# Patient Record
Sex: Female | Born: 1975 | Race: Black or African American | Hispanic: No | Marital: Married | Smoking: Never smoker
Health system: Southern US, Community
[De-identification: ages and names within clinical notes are randomized; demographics above are authoritative.]

## PROBLEM LIST (undated history)

## (undated) DIAGNOSIS — D649 Anemia, unspecified: Secondary | ICD-10-CM

## (undated) DIAGNOSIS — I10 Essential (primary) hypertension: Secondary | ICD-10-CM

## (undated) HISTORY — PX: CHOLECYSTECTOMY: SHX55

## (undated) HISTORY — PX: ABDOMINAL SURGERY: SHX537

---

## 1998-04-19 ENCOUNTER — Other Ambulatory Visit: Admission: RE | Admit: 1998-04-19 | Discharge: 1998-04-19 | Payer: Self-pay | Admitting: Obstetrics and Gynecology

## 1998-11-29 ENCOUNTER — Encounter: Payer: Self-pay | Admitting: Gastroenterology

## 1998-11-29 ENCOUNTER — Encounter: Admission: RE | Admit: 1998-11-29 | Discharge: 1998-11-29 | Payer: Self-pay | Admitting: Gastroenterology

## 1999-02-22 ENCOUNTER — Other Ambulatory Visit: Admission: RE | Admit: 1999-02-22 | Discharge: 1999-02-22 | Payer: Self-pay | Admitting: Obstetrics and Gynecology

## 2000-02-02 ENCOUNTER — Inpatient Hospital Stay (HOSPITAL_COMMUNITY): Admission: AD | Admit: 2000-02-02 | Discharge: 2000-02-02 | Payer: Self-pay | Admitting: Obstetrics and Gynecology

## 2000-03-19 ENCOUNTER — Other Ambulatory Visit: Admission: RE | Admit: 2000-03-19 | Discharge: 2000-03-19 | Payer: Self-pay | Admitting: Obstetrics and Gynecology

## 2001-01-14 ENCOUNTER — Encounter: Payer: Self-pay | Admitting: Internal Medicine

## 2001-01-14 ENCOUNTER — Encounter: Admission: RE | Admit: 2001-01-14 | Discharge: 2001-01-14 | Payer: Self-pay | Admitting: Internal Medicine

## 2001-05-01 ENCOUNTER — Inpatient Hospital Stay (HOSPITAL_COMMUNITY): Admission: AD | Admit: 2001-05-01 | Discharge: 2001-05-01 | Payer: Self-pay | Admitting: Obstetrics and Gynecology

## 2002-05-01 ENCOUNTER — Other Ambulatory Visit: Admission: RE | Admit: 2002-05-01 | Discharge: 2002-05-01 | Payer: Self-pay | Admitting: *Deleted

## 2002-06-01 ENCOUNTER — Ambulatory Visit (HOSPITAL_COMMUNITY): Admission: RE | Admit: 2002-06-01 | Discharge: 2002-06-01 | Payer: Self-pay | Admitting: *Deleted

## 2002-06-01 ENCOUNTER — Encounter: Payer: Self-pay | Admitting: *Deleted

## 2003-02-14 ENCOUNTER — Emergency Department (HOSPITAL_COMMUNITY): Admission: AC | Admit: 2003-02-14 | Discharge: 2003-02-14 | Payer: Self-pay

## 2003-07-29 ENCOUNTER — Other Ambulatory Visit: Admission: RE | Admit: 2003-07-29 | Discharge: 2003-07-29 | Payer: Self-pay | Admitting: Family Medicine

## 2005-02-05 ENCOUNTER — Other Ambulatory Visit: Admission: RE | Admit: 2005-02-05 | Discharge: 2005-02-05 | Payer: Self-pay | Admitting: Family Medicine

## 2006-03-22 ENCOUNTER — Other Ambulatory Visit: Admission: RE | Admit: 2006-03-22 | Discharge: 2006-03-22 | Payer: Self-pay | Admitting: Family Medicine

## 2007-06-19 ENCOUNTER — Other Ambulatory Visit: Admission: RE | Admit: 2007-06-19 | Discharge: 2007-06-19 | Payer: Self-pay | Admitting: Family Medicine

## 2010-02-19 ENCOUNTER — Encounter: Payer: Self-pay | Admitting: Gynecology

## 2010-11-28 ENCOUNTER — Other Ambulatory Visit (HOSPITAL_COMMUNITY): Payer: Self-pay | Admitting: Gynecology

## 2010-11-28 DIAGNOSIS — N979 Female infertility, unspecified: Secondary | ICD-10-CM

## 2010-11-30 ENCOUNTER — Ambulatory Visit (HOSPITAL_COMMUNITY)
Admission: RE | Admit: 2010-11-30 | Discharge: 2010-11-30 | Disposition: A | Discharge status: Home / Self Care | Payer: BC Managed Care – PPO | Source: Ambulatory Visit | Attending: Gynecology | Admitting: Gynecology

## 2010-11-30 DIAGNOSIS — N979 Female infertility, unspecified: Secondary | ICD-10-CM

## 2010-11-30 MED ORDER — IOHEXOL 300 MG/ML  SOLN
40.0000 mL | Freq: Once | INTRAMUSCULAR | Status: AC | PRN
  Administered 2010-11-30: 40 mL

## 2011-03-02 ENCOUNTER — Other Ambulatory Visit: Payer: Self-pay | Admitting: Obstetrics and Gynecology

## 2011-03-02 DIAGNOSIS — D219 Benign neoplasm of connective and other soft tissue, unspecified: Secondary | ICD-10-CM

## 2011-04-04 ENCOUNTER — Other Ambulatory Visit: Payer: BC Managed Care – PPO

## 2011-04-17 ENCOUNTER — Ambulatory Visit
Admission: RE | Admit: 2011-04-17 | Discharge: 2011-04-17 | Disposition: A | Discharge status: Home / Self Care | Payer: BC Managed Care – PPO | Source: Ambulatory Visit | Attending: Obstetrics and Gynecology | Admitting: Obstetrics and Gynecology

## 2011-04-17 DIAGNOSIS — D219 Benign neoplasm of connective and other soft tissue, unspecified: Secondary | ICD-10-CM

## 2011-04-17 MED ORDER — GADOBENATE DIMEGLUMINE 529 MG/ML IV SOLN
20.0000 mL | Freq: Once | INTRAVENOUS | Status: AC | PRN
  Administered 2011-04-17: 20 mL via INTRAVENOUS

## 2011-08-31 ENCOUNTER — Ambulatory Visit: Admit: 2011-08-31 | Payer: Self-pay | Admitting: Obstetrics and Gynecology

## 2011-08-31 SURGERY — ROBOTIC ASSISTED MYOMECTOMY
Anesthesia: General

## 2011-09-07 ENCOUNTER — Other Ambulatory Visit: Payer: Self-pay | Admitting: Obstetrics and Gynecology

## 2011-09-11 ENCOUNTER — Encounter (HOSPITAL_COMMUNITY): Payer: Self-pay | Admitting: Pharmacist

## 2011-09-14 ENCOUNTER — Encounter (HOSPITAL_COMMUNITY)
Admission: RE | Admit: 2011-09-14 | Discharge: 2011-09-14 | Disposition: A | Discharge status: Home / Self Care | Payer: BC Managed Care – PPO | Source: Ambulatory Visit | Attending: Obstetrics and Gynecology | Admitting: Obstetrics and Gynecology

## 2011-09-14 ENCOUNTER — Encounter (HOSPITAL_COMMUNITY): Payer: Self-pay

## 2011-09-14 HISTORY — DX: Anemia, unspecified: D64.9

## 2011-09-14 LAB — CBC
HCT: 35.8 % — ABNORMAL LOW (ref 36.0–46.0)
Hemoglobin: 11.7 g/dL — ABNORMAL LOW (ref 12.0–15.0)
MCH: 24.5 pg — ABNORMAL LOW (ref 26.0–34.0)
MCHC: 32.7 g/dL (ref 30.0–36.0)
MCV: 74.9 fL — ABNORMAL LOW (ref 78.0–100.0)
Platelets: 224 10*3/uL (ref 150–400)
RBC: 4.78 MIL/uL (ref 3.87–5.11)
RDW: 17.2 % — ABNORMAL HIGH (ref 11.5–15.5)
WBC: 6.8 10*3/uL (ref 4.0–10.5)

## 2011-09-14 LAB — SURGICAL PCR SCREEN
MRSA, PCR: NEGATIVE
Staphylococcus aureus: NEGATIVE

## 2011-09-14 NOTE — Patient Instructions (Addendum)
Your procedure is scheduled on:09/21/11  Enter through the Main Entrance at :6am Pick up desk phone and dial [phone] and inform us of your arrival.  Please call [phone] if you have any problems the morning of surgery.  Remember: Do not eat after midnight:Thursday Do not drink after:midnight Thursday  Take these meds the morning of surgery with a sip of water:NONE  DO NOT wear jewelry, eye make-up, lipstick,body lotion, or dark fingernail polish. Do not shave for 48 hours prior to surgery.  If you are to be admitted after surgery, leave suitcase in car until your room has been assigned. Patients discharged on the day of surgery will not be allowed to drive home.   Remember to use your Hibiclens as instructed.

## 2011-09-17 ENCOUNTER — Other Ambulatory Visit: Payer: Self-pay | Admitting: Obstetrics and Gynecology

## 2011-09-21 ENCOUNTER — Encounter (HOSPITAL_COMMUNITY)
Admission: RE | Disposition: A | Discharge status: Home / Self Care | Payer: Self-pay | Source: Ambulatory Visit | Attending: Obstetrics and Gynecology

## 2011-09-21 ENCOUNTER — Encounter (HOSPITAL_COMMUNITY): Payer: Self-pay | Admitting: *Deleted

## 2011-09-21 ENCOUNTER — Encounter (HOSPITAL_COMMUNITY): Payer: Self-pay | Admitting: Anesthesiology

## 2011-09-21 ENCOUNTER — Observation Stay (HOSPITAL_COMMUNITY)
Admission: RE | Admit: 2011-09-21 | Discharge: 2011-09-21 | Disposition: A | Discharge status: Home / Self Care | Payer: BC Managed Care – PPO | Source: Ambulatory Visit | Attending: Obstetrics and Gynecology | Admitting: Obstetrics and Gynecology

## 2011-09-21 ENCOUNTER — Ambulatory Visit (HOSPITAL_COMMUNITY): Payer: BC Managed Care – PPO | Admitting: Anesthesiology

## 2011-09-21 DIAGNOSIS — N920 Excessive and frequent menstruation with regular cycle: Principal | ICD-10-CM | POA: Insufficient documentation

## 2011-09-21 DIAGNOSIS — D649 Anemia, unspecified: Secondary | ICD-10-CM | POA: Insufficient documentation

## 2011-09-21 DIAGNOSIS — Z9889 Other specified postprocedural states: Secondary | ICD-10-CM

## 2011-09-21 DIAGNOSIS — D259 Leiomyoma of uterus, unspecified: Secondary | ICD-10-CM | POA: Insufficient documentation

## 2011-09-21 HISTORY — PX: ROBOT ASSISTED MYOMECTOMY: SHX5142

## 2011-09-21 LAB — BASIC METABOLIC PANEL
BUN: 7 mg/dL (ref 6–23)
CO2: 26 mEq/L (ref 19–32)
Calcium: 8.5 mg/dL (ref 8.4–10.5)
Chloride: 101 mEq/L (ref 96–112)
Creatinine, Ser: 0.85 mg/dL (ref 0.50–1.10)
GFR calc Af Amer: >90 mL/min (ref >90)
GFR calc non Af Amer: 86 mL/min — ABNORMAL LOW (ref >90)
Glucose, Bld: 145 mg/dL — ABNORMAL HIGH (ref 70–99)
Potassium: 3.8 mEq/L (ref 3.5–5.1)
Sodium: 135 mEq/L (ref 135–145)

## 2011-09-21 LAB — CBC
HCT: 35.4 % — ABNORMAL LOW (ref 36.0–46.0)
Hemoglobin: 11.6 g/dL — ABNORMAL LOW (ref 12.0–15.0)
MCH: 24.6 pg — ABNORMAL LOW (ref 26.0–34.0)
MCHC: 32.8 g/dL (ref 30.0–36.0)
MCV: 75.2 fL — ABNORMAL LOW (ref 78.0–100.0)
Platelets: 196 10*3/uL (ref 150–400)
RBC: 4.71 MIL/uL (ref 3.87–5.11)
RDW: 17.5 % — ABNORMAL HIGH (ref 11.5–15.5)
WBC: 12.2 10*3/uL — ABNORMAL HIGH (ref 4.0–10.5)

## 2011-09-21 LAB — TYPE AND SCREEN
ABO/RH(D): O NEG
Antibody Screen: NEGATIVE

## 2011-09-21 LAB — GLUCOSE, CAPILLARY
Glucose-Capillary: 106 mg/dL — ABNORMAL HIGH (ref 70–99)
Glucose-Capillary: 145 mg/dL — ABNORMAL HIGH (ref 70–99)

## 2011-09-21 LAB — PREGNANCY, URINE: Preg Test, Ur: NEGATIVE

## 2011-09-21 LAB — ABO/RH: ABO/RH(D): O NEG

## 2011-09-21 SURGERY — ROBOTIC ASSISTED MYOMECTOMY
Anesthesia: General | Site: Uterus | Wound class: Clean Contaminated

## 2011-09-21 MED ORDER — METRONIDAZOLE 500 MG PO TABS
500.0000 mg | ORAL_TABLET | Freq: Two times a day (BID) | ORAL | Status: DC
  Administered 2011-09-21: 500 mg via ORAL
  Filled 2011-09-21 (×3): qty 1

## 2011-09-21 MED ORDER — SODIUM CHLORIDE 0.9 % IJ SOLN
INTRAMUSCULAR | Status: DC | PRN
  Administered 2011-09-21: 60 mL via INTRAVENOUS

## 2011-09-21 MED ORDER — ONDANSETRON HCL 4 MG/2ML IJ SOLN
4.0000 mg | Freq: Four times a day (QID) | INTRAMUSCULAR | Status: DC | PRN
  Administered 2011-09-21: 4 mg via INTRAVENOUS
  Filled 2011-09-21: qty 2

## 2011-09-21 MED ORDER — ROCURONIUM BROMIDE 50 MG/5ML IV SOLN
INTRAVENOUS | Status: AC
  Filled 2011-09-21: qty 1

## 2011-09-21 MED ORDER — HYDROMORPHONE HCL PF 1 MG/ML IJ SOLN: 0.2000 mg | INTRAMUSCULAR | Status: DC | PRN

## 2011-09-21 MED ORDER — BUPIVACAINE HCL (PF) 0.25 % IJ SOLN
INTRAMUSCULAR | Status: DC | PRN
  Administered 2011-09-21: 10 mL

## 2011-09-21 MED ORDER — OXYCODONE-ACETAMINOPHEN 5-325 MG PO TABS: 1.0000 | ORAL_TABLET | ORAL | Status: DC | PRN

## 2011-09-21 MED ORDER — METHYLENE BLUE 1 % INJ SOLN
INTRAMUSCULAR | Status: AC
  Filled 2011-09-21: qty 1

## 2011-09-21 MED ORDER — HYDROMORPHONE HCL PF 1 MG/ML IJ SOLN
0.2500 mg | INTRAMUSCULAR | Status: DC | PRN
  Administered 2011-09-21 (×4): 0.5 mg via INTRAVENOUS

## 2011-09-21 MED ORDER — OXYCODONE-ACETAMINOPHEN 5-325 MG PO TABS: 1.0000 | ORAL_TABLET | ORAL | Status: AC | PRN

## 2011-09-21 MED ORDER — LIDOCAINE HCL (CARDIAC) 20 MG/ML IV SOLN
INTRAVENOUS | Status: AC
  Filled 2011-09-21: qty 5

## 2011-09-21 MED ORDER — HYDROCODONE-ACETAMINOPHEN 5-325 MG PO TABS: 1.0000 | ORAL_TABLET | ORAL | Status: DC | PRN

## 2011-09-21 MED ORDER — ONDANSETRON HCL 4 MG/2ML IJ SOLN
INTRAMUSCULAR | Status: AC
  Filled 2011-09-21: qty 2

## 2011-09-21 MED ORDER — PROPOFOL 10 MG/ML IV EMUL
INTRAVENOUS | Status: AC
  Filled 2011-09-21: qty 20

## 2011-09-21 MED ORDER — BUPIVACAINE HCL (PF) 0.25 % IJ SOLN
INTRAMUSCULAR | Status: AC
  Filled 2011-09-21: qty 30

## 2011-09-21 MED ORDER — LACTATED RINGERS IR SOLN
Status: DC | PRN
  Administered 2011-09-21: 1

## 2011-09-21 MED ORDER — PROPOFOL 10 MG/ML IV EMUL
INTRAVENOUS | Status: DC | PRN
  Administered 2011-09-21: 200 mg via INTRAVENOUS

## 2011-09-21 MED ORDER — DEXAMETHASONE SODIUM PHOSPHATE 4 MG/ML IJ SOLN
INTRAMUSCULAR | Status: DC | PRN
  Administered 2011-09-21: 8 mg via INTRAVENOUS

## 2011-09-21 MED ORDER — HYDROMORPHONE HCL PF 1 MG/ML IJ SOLN
INTRAMUSCULAR | Status: AC
Start: 2011-09-21 — End: 2011-09-21
  Administered 2011-09-21: 0.5 mg via INTRAVENOUS
  Filled 2011-09-21: qty 1

## 2011-09-21 MED ORDER — CEFAZOLIN SODIUM 1-5 GM-% IV SOLN
1.0000 g | Freq: Three times a day (TID) | INTRAVENOUS | Status: DC
  Administered 2011-09-21: 1 g via INTRAVENOUS
  Filled 2011-09-21 (×2): qty 50

## 2011-09-21 MED ORDER — GLYCOPYRROLATE 0.2 MG/ML IJ SOLN
INTRAMUSCULAR | Status: AC
  Filled 2011-09-21: qty 2

## 2011-09-21 MED ORDER — CEFAZOLIN SODIUM-DEXTROSE 2-3 GM-% IV SOLR
INTRAVENOUS | Status: AC
  Filled 2011-09-21: qty 50

## 2011-09-21 MED ORDER — POLYSACCHARIDE IRON COMPLEX 150 MG PO CAPS
150.0000 mg | ORAL_CAPSULE | Freq: Two times a day (BID) | ORAL | Status: DC
  Administered 2011-09-21: 150 mg via ORAL
  Filled 2011-09-21 (×2): qty 1

## 2011-09-21 MED ORDER — IBUPROFEN 800 MG PO TABS: 800.0000 mg | ORAL_TABLET | Freq: Three times a day (TID) | ORAL | Status: DC | PRN

## 2011-09-21 MED ORDER — NEOSTIGMINE METHYLSULFATE 1 MG/ML IJ SOLN
INTRAMUSCULAR | Status: DC | PRN
  Administered 2011-09-21: 3 mg via INTRAVENOUS

## 2011-09-21 MED ORDER — KETOROLAC TROMETHAMINE 30 MG/ML IJ SOLN
30.0000 mg | Freq: Four times a day (QID) | INTRAMUSCULAR | Status: DC
  Administered 2011-09-21: 30 mg via INTRAMUSCULAR
  Filled 2011-09-21: qty 1

## 2011-09-21 MED ORDER — FENTANYL CITRATE 0.05 MG/ML IJ SOLN
INTRAMUSCULAR | Status: DC | PRN
  Administered 2011-09-21 (×4): 50 ug via INTRAVENOUS
  Administered 2011-09-21: 100 ug via INTRAVENOUS

## 2011-09-21 MED ORDER — MENTHOL 3 MG MT LOZG: 1.0000 | LOZENGE | OROMUCOSAL | Status: DC | PRN

## 2011-09-21 MED ORDER — VASOPRESSIN 20 UNIT/ML IJ SOLN
INTRAMUSCULAR | Status: DC | PRN
  Administered 2011-09-21: 40 [IU]

## 2011-09-21 MED ORDER — DEXAMETHASONE SODIUM PHOSPHATE 10 MG/ML IJ SOLN
INTRAMUSCULAR | Status: AC
  Filled 2011-09-21: qty 1

## 2011-09-21 MED ORDER — LIDOCAINE HCL (CARDIAC) 20 MG/ML IV SOLN
INTRAVENOUS | Status: DC | PRN
  Administered 2011-09-21: 50 mg via INTRAVENOUS

## 2011-09-21 MED ORDER — ROCURONIUM BROMIDE 100 MG/10ML IV SOLN
INTRAVENOUS | Status: DC | PRN
  Administered 2011-09-21 (×3): 10 mg via INTRAVENOUS
  Administered 2011-09-21: 50 mg via INTRAVENOUS

## 2011-09-21 MED ORDER — CEFAZOLIN SODIUM-DEXTROSE 2-3 GM-% IV SOLR
2.0000 g | INTRAVENOUS | Status: AC
  Administered 2011-09-21: 2 g via INTRAVENOUS

## 2011-09-21 MED ORDER — PANTOPRAZOLE SODIUM 40 MG PO TBEC
40.0000 mg | DELAYED_RELEASE_TABLET | Freq: Every day | ORAL | Status: DC
  Administered 2011-09-21: 40 mg via ORAL
  Filled 2011-09-21 (×2): qty 1

## 2011-09-21 MED ORDER — FENTANYL CITRATE 0.05 MG/ML IJ SOLN
INTRAMUSCULAR | Status: AC
  Filled 2011-09-21: qty 5

## 2011-09-21 MED ORDER — MIDAZOLAM HCL 5 MG/5ML IJ SOLN
INTRAMUSCULAR | Status: DC | PRN
  Administered 2011-09-21: 2 mg via INTRAVENOUS

## 2011-09-21 MED ORDER — LACTATED RINGERS IV SOLN
INTRAVENOUS | Status: DC
  Administered 2011-09-21 (×2): via INTRAVENOUS

## 2011-09-21 MED ORDER — ONDANSETRON HCL 4 MG PO TABS: 4.0000 mg | ORAL_TABLET | Freq: Four times a day (QID) | ORAL | Status: DC | PRN

## 2011-09-21 MED ORDER — KETOROLAC TROMETHAMINE 30 MG/ML IJ SOLN: 30.0000 mg | Freq: Four times a day (QID) | INTRAMUSCULAR | Status: DC

## 2011-09-21 MED ORDER — ACETAMINOPHEN 10 MG/ML IV SOLN
1000.0000 mg | Freq: Once | INTRAVENOUS | Status: AC
  Administered 2011-09-21: 1000 mg via INTRAVENOUS
  Filled 2011-09-21: qty 100

## 2011-09-21 MED ORDER — ZOLPIDEM TARTRATE 5 MG PO TABS: 5.0000 mg | ORAL_TABLET | Freq: Every evening | ORAL | Status: DC | PRN

## 2011-09-21 MED ORDER — DEXTROSE IN LACTATED RINGERS 5 % IV SOLN: INTRAVENOUS | Status: DC

## 2011-09-21 MED ORDER — KETOROLAC TROMETHAMINE 30 MG/ML IJ SOLN
INTRAMUSCULAR | Status: AC
  Filled 2011-09-21: qty 1

## 2011-09-21 MED ORDER — PROMETHAZINE HCL 25 MG/ML IJ SOLN
25.0000 mg | Freq: Four times a day (QID) | INTRAMUSCULAR | Status: DC | PRN
  Filled 2011-09-21: qty 1

## 2011-09-21 MED ORDER — KETOROLAC TROMETHAMINE 30 MG/ML IJ SOLN
INTRAMUSCULAR | Status: DC | PRN
  Administered 2011-09-21: 30 mg via INTRAVENOUS

## 2011-09-21 MED ORDER — ONDANSETRON HCL 4 MG/2ML IJ SOLN
INTRAMUSCULAR | Status: DC | PRN
  Administered 2011-09-21: 4 mg via INTRAVENOUS

## 2011-09-21 MED ORDER — MIDAZOLAM HCL 2 MG/2ML IJ SOLN
INTRAMUSCULAR | Status: AC
  Filled 2011-09-21: qty 2

## 2011-09-21 MED ORDER — NEOSTIGMINE METHYLSULFATE 1 MG/ML IJ SOLN
INTRAMUSCULAR | Status: AC
  Filled 2011-09-21: qty 10

## 2011-09-21 MED ORDER — HYDROMORPHONE HCL PF 1 MG/ML IJ SOLN
INTRAMUSCULAR | Status: AC
  Administered 2011-09-21: 0.5 mg via INTRAVENOUS
  Filled 2011-09-21: qty 1

## 2011-09-21 MED ORDER — VASOPRESSIN 20 UNIT/ML IJ SOLN
INTRAMUSCULAR | Status: AC
  Filled 2011-09-21: qty 2

## 2011-09-21 MED ORDER — GLYCOPYRROLATE 0.2 MG/ML IJ SOLN
INTRAMUSCULAR | Status: DC | PRN
  Administered 2011-09-21: 0.6 mg via INTRAVENOUS

## 2011-09-21 SURGICAL SUPPLY — 55 items
ADH SKN CLS APL DERMABOND .7 (GAUZE/BANDAGES/DRESSINGS) ×2
BARRIER ADHS 3X4 INTERCEED (GAUZE/BANDAGES/DRESSINGS) ×3 IMPLANT
BLADE LAP MORCELLATOR 15X9.5 (ELECTROSURGICAL) ×3 IMPLANT
BRR ADH 4X3 ABS CNTRL BYND (GAUZE/BANDAGES/DRESSINGS) ×2
CABLE HIGH FREQUENCY MONO STRZ (ELECTRODE) ×3 IMPLANT
CANNULA SEAL DVNC (CANNULA) ×6 IMPLANT
CANNULA SEALS DA VINCI (CANNULA) ×3
CHLORAPREP W/TINT 26ML (MISCELLANEOUS) ×3 IMPLANT
CONT PATH 16OZ SNAP LID 3702 (MISCELLANEOUS) ×3 IMPLANT
COVER MAYO STAND STRL (DRAPES) ×3 IMPLANT
COVER TABLE BACK 60X90 (DRAPES) ×6 IMPLANT
COVER TIP SHEARS 8 DVNC (MISCELLANEOUS) ×2 IMPLANT
COVER TIP SHEARS 8MM DA VINCI (MISCELLANEOUS) ×1
DECANTER SPIKE VIAL GLASS SM (MISCELLANEOUS) ×3 IMPLANT
DERMABOND ADVANCED (GAUZE/BANDAGES/DRESSINGS) ×1
DERMABOND ADVANCED .7 DNX12 (GAUZE/BANDAGES/DRESSINGS) ×2 IMPLANT
DEVICE TROCAR PUNCTURE CLOSURE (ENDOMECHANICALS) ×3 IMPLANT
DRAPE HUG U DISPOSABLE (DRAPE) ×3 IMPLANT
DRAPE LG THREE QUARTER DISP (DRAPES) ×6 IMPLANT
DRAPE WARM FLUID 44X44 (DRAPE) ×3 IMPLANT
ELECT REM PT RETURN 9FT ADLT (ELECTROSURGICAL) ×3
ELECTRODE REM PT RTRN 9FT ADLT (ELECTROSURGICAL) ×2 IMPLANT
EVACUATOR SMOKE 8.L (FILTER) ×3 IMPLANT
GLOVE BIO SURGEON STRL SZ 6.5 (GLOVE) ×6 IMPLANT
GLOVE BIOGEL PI IND STRL 7.0 (GLOVE) ×6 IMPLANT
GLOVE BIOGEL PI INDICATOR 7.0 (GLOVE) ×3
GOWN STRL REIN XL XLG (GOWN DISPOSABLE) ×18 IMPLANT
KIT ACCESSORY DA VINCI DISP (KITS) ×1
KIT ACCESSORY DVNC DISP (KITS) ×2 IMPLANT
KIT DISP ACCESSORY 4 ARM (KITS) IMPLANT
NDL INSUFFLATION 14GA 150MM (NEEDLE) IMPLANT
NEEDLE INSUFFLATION 14GA 150MM (NEEDLE) ×3 IMPLANT
PACK LAVH (CUSTOM PROCEDURE TRAY) ×3 IMPLANT
SET IRRIG TUBING LAPAROSCOPIC (IRRIGATION / IRRIGATOR) ×3 IMPLANT
SOLUTION ELECTROLUBE (MISCELLANEOUS) ×3 IMPLANT
SUT VIC AB 0 CT1 27 (SUTURE) ×6
SUT VIC AB 0 CT1 27XBRD ANBCTR (SUTURE) ×4 IMPLANT
SUT VICRYL 0 UR6 27IN ABS (SUTURE) ×3 IMPLANT
SUT VICRYL RAPIDE 4/0 PS 2 (SUTURE) ×6 IMPLANT
SUT VLOC 180 0 9IN  GS21 (SUTURE) ×2
SUT VLOC 180 0 9IN GS21 (SUTURE) IMPLANT
SUT VLOC 180 2-0 9IN GS21 (SUTURE) ×1 IMPLANT
SYR 30ML LL (SYRINGE) ×2 IMPLANT
SYR 50ML LL SCALE MARK (SYRINGE) ×3 IMPLANT
SYR TB 1ML 25GX5/8 (SYRINGE) ×1 IMPLANT
TIP UTERINE 6.7X8CM BLUE DISP (MISCELLANEOUS) ×2 IMPLANT
TOWEL OR 17X24 6PK STRL BLUE (TOWEL DISPOSABLE) ×9 IMPLANT
TRAY FOLEY BAG SILVER LF 14FR (CATHETERS) ×3 IMPLANT
TROCAR 12M 150ML BLUNT (TROCAR) ×2 IMPLANT
TROCAR DISP BLADELESS 8 DVNC (TROCAR) ×2 IMPLANT
TROCAR DISP BLADELESS 8MM (TROCAR) ×1
TROCAR Z-THREAD 12X150 (TROCAR) ×3 IMPLANT
TUBING FILTER THERMOFLATOR (ELECTROSURGICAL) ×3 IMPLANT
WARMER LAPAROSCOPE (MISCELLANEOUS) ×3 IMPLANT
WATER STERILE IRR 1000ML POUR (IV SOLUTION) ×6 IMPLANT

## 2011-09-21 NOTE — Anesthesia Postprocedure Evaluation (Signed)
  Anesthesia Post-op Note  Patient: Brianna [name]  Procedure(s) Performed: Procedure(s) (LRB): ROBOTIC ASSISTED MYOMECTOMY (N/A) CHROMOPERTUBATION () Patient is awake and responsive. Pain and nausea are reasonably well controlled. Vital signs are stable and clinically acceptable. Persistant c/o heaviness in chest led to EKG in PACU showing NSR(NL) Oxygen saturation is clinically acceptable. There are no apparent anesthetic complications at this time. Patient is ready for discharge.

## 2011-09-21 NOTE — Anesthesia Preprocedure Evaluation (Signed)
Anesthesia Evaluation  Patient identified by MRN, date of birth, ID band Patient awake    Reviewed: Allergy & Precautions, H&P , Patient's Chart, lab work & pertinent test results, reviewed documented beta blocker date and time   Airway Mallampati: II TM Distance: >3 FB Neck ROM: full    Dental No notable dental hx.    Pulmonary  breath sounds clear to auscultation  Pulmonary exam normal       Cardiovascular Rhythm:regular Rate:Normal     Neuro/Psych    GI/Hepatic   Endo/Other  Morbid obesityNo Meds. Diet controlled  Renal/GU      Musculoskeletal   Abdominal   Peds  Hematology   Anesthesia Other Findings   Reproductive/Obstetrics                           Anesthesia Physical Anesthesia Plan  ASA: III  Anesthesia Plan:    Post-op Pain Management:    Induction:   Airway Management Planned:   Additional Equipment:   Intra-op Plan:   Post-operative Plan:   Informed Consent: I have reviewed the patients History and Physical, chart, labs and discussed the procedure including the risks, benefits and alternatives for the proposed anesthesia with the patient or authorized representative who has indicated his/her understanding and acceptance.   Dental Advisory Given  Plan Discussed with: CRNA and Surgeon  Anesthesia Plan Comments:         Anesthesia Quick Evaluation

## 2011-09-21 NOTE — Transfer of Care (Signed)
Immediate Anesthesia Transfer of Care Note  Patient: Brianna [name]  Procedure(s) Performed: Procedure(s) (LRB): ROBOTIC ASSISTED MYOMECTOMY (N/A) CHROMOPERTUBATION ()  Patient Location: PACU  Anesthesia Type: General  Level of Consciousness: awake and patient cooperative  Airway & Oxygen Therapy: Patient Spontanous Breathing and Patient connected to nasal cannula oxygen  Post-op Assessment: Report given to PACU RN and Post -op Vital signs reviewed and stable  Post vital signs: Reviewed and stable  Complications: No apparent anesthesia complications

## 2011-09-21 NOTE — Discharge Summary (Signed)
Physician Discharge Summary  Patient ID: Brianna [name] MRN: [id number] DOB/AGE: Jun 01, 1975 36 y.o.  Admit date: 09/21/2011 Discharge date: 09/21/2011  Admission Diagnoses: menorrhagia, fibroid uterus, anemia, abnormal HSG  Discharge Diagnoses:  Subserosal/submucosal fibroid, menorrhagia, abnormal HSG, anemia Active Problems:  * No active hospital problems. *    Discharged Condition: stable  Hospital Course: Pt admitted to [facility]. She underwent Davinci robotic myomectomy w/ chromopertubation( see operative report) Patient  observed postop and sent home after tolerating  diet and voiding. She had mild postoperative nausea respond to med and resolved  Consults: None  Significant Diagnostic Studies: labs: see preop labs  Treatments: surgery: Davinci robotic  myomectomy Discharge Exam: Blood pressure 135/90, pulse 75, temperature 98.2 F (36.8 C), temperature source Oral, resp. rate 20, height 5\' 4"  (1.626 m), weight 98.884 kg (218 lb), last menstrual period 09/15/2011, SpO2 100.00%. General appearance: alert, cooperative and no distress  Disposition: Final discharge disposition not confirmed  Discharge Orders    Future Orders Please Complete By Expires   Diet general      Increase activity slowly      Discharge instructions      Comments:   CALL  IF TEMP>100.4, NOTHING PER VAGINA X 2 WK, CALL IF SOAKING A MAXI  PAD EVERY HOUR OR MORE FREQUENTLY   No wound care        Medication List  As of 09/21/2011  9:24 PM   TAKE these medications         INTEGRA PLUS PO   Take 1 capsule by mouth daily.      naproxen sodium 220 MG tablet   Commonly known as: ANAPROX   Take 220 mg by mouth daily as needed. For cramps      oxyCODONE-acetaminophen 5-325 MG per tablet   Commonly known as: PERCOCET/ROXICET   Take 1-2 tablets by mouth every 4 (four) hours as needed (moderate to severe pain (when tolerating fluids)).           Follow-up Information    Follow up with Christana Angelica A,  MD in 2 weeks.   Contact information:   [address] [place] [address] [phone]          Signed: Serita Kyle 09/21/2011, 9:24 PM

## 2011-09-21 NOTE — Brief Op Note (Signed)
09/21/2011  10:46 AM  PATIENT:  Brianna [name]  36 y.o. female  PRE-OPERATIVE DIAGNOSIS:  Fibroid uterus,  Microcytic Anemia, menorrhagia, abnormal HSG  POST-OPERATIVE DIAGNOSIS:  Fibroids, microcytic anemia, menorrhagia, abnormal HSG  PROCEDURE:  Procedure(s) (LRB): DAVINCI ROBOTIC ASSISTED MYOMECTOMY (N/A) CHROMOPERTUBATION ()  SURGEON:  Surgeon(s) and Role:    * Nilsa Macht Cathie Beams, MD - Primary    * Robley Fries, MD - Assisting  PHYSICIAN ASSISTANT:   ASSISTANTS: Shea Evans, MD   ANESTHESIA:   general FINDINGS;  Large anterior subserosal/submucosal fibroid, nl ovaries, nl appearing tubes but did not fill or spill. Nl appendix. Some posterior adhesions lysed  EBL:  Total I/O In: 1000 [I.V.:1000] Out: 200 [Urine:150; Blood:50]  BLOOD ADMINISTERED:none  DRAINS: none   LOCAL MEDICATIONS USED:  MARCAINE     SPECIMEN:  Source of Specimen:  morcellated fibroid  DISPOSITION OF SPECIMEN:  PATHOLOGY  COUNTS:  YES  TOURNIQUET:  * No tourniquets in log *  DICTATION: .Other Dictation: Dictation Number   PLAN OF CARE: Discharge to home after PACU  PATIENT DISPOSITION:  PACU - hemodynamically stable.   Delay start of Pharmacological VTE agent (>24hrs) due to surgical blood loss or risk of bleeding: no

## 2011-09-23 NOTE — Op Note (Signed)
[name], [name]               ACCOUNT NO.:  [id number]  MEDICAL RECORD NO.:  [id number]  LOCATION:  9319                          FACILITY:  [facility]  PHYSICIAN:  Maxie Better, M.D.DATE OF BIRTH:  08-07-1975  DATE OF PROCEDURE:  09/21/2011 DATE OF DISCHARGE:  09/21/2011                              OPERATIVE REPORT   PREOPERATIVE DIAGNOSES:  Menorrhagia, anemia, uterine fibroids.  POSTOPERATIVE DIAGNOSES:  Fibroid uterus, menorrhagia, and anemia.  PROCEDURES:  Da Vinci robotic myomectomy, chromopertubation.  ANESTHESIA:  General.  SURGEON:  Maxie Better, M.D.  ASSISTANT:  Brianna Nestle, MD  PROCEDURE:  Under adequate general anesthesia, the patient was positioned for robotic surgery.  Examination under anesthesia revealed a 12-13 week size uterus with a palpable mass anteriorly.  The patient was sterilely prepped and draped in usual fashion.  An indwelling Foley catheter was sterilely placed.  Weighted speculum was placed in vagina. Sims retractor was used anteriorly.  The cervix was grasped with a single-tooth tenaculum.  The uterus then sounded to 8 cm.  A #8 uterine manipulator was introduced with a medium size being placed in the uterus without incident.  The retractors were removed.  Attention was then turned to the abdomen.  Marcaine 0.25% was injected supraumbilically. Supraumbilical incision was then made.  Veress needle was introduced and tested with normal saline.  Carbon dioxide was insufflated with opening pressure of 5, 3 L of CO2 was insufflated.  Veress needle was then removed.  A 12-mm disposable trocar was introduced into the abdomen without incident.  The robotic camera was then inserted.  At that point, it was noted that the uterus had a large anterior fibroids.  Both ovaries appeared to be normal.  The patient was then positioned. Additional robotic ports were then placed, 2 on the left with 8 mm port sites and one 8 mm port site on the right,  with 12 mm assistant port placement in the right lower quadrant.  The robotic ports were then placed under direct visualization.  The pelvis was inspected.  There was some adhesion in the posterior aspect of the uterus.  Tubes appeared otherwise normal.  There was a left small clear fluid cyst on the left ovary, but otherwise normal, and the right ovary was normal.  Normal liver was seen.  No other lesions noted in the anterior or posterior cul- de-sac.  The robot was then centered docked with arm #1 Permanent cautery hook, arm #2: PK dissector, and arm #3 Prograsp subsequently placed.  I then went to the surgical console just prior to docking the robot, dilute solution of Pitressin was injected into the serosal surface overlying the fibroid that was to be removed.  At the surgical console, the uterus was manipulated, chromopertubation was done.  No spillage or filling was seen after 180 mL of fluid methylene blue was injected.  At that point, attention was then turned to the fibroid that was noted anteriorly.  No other fibroids were seen.  With the dilute solution of Pitressin, a transverse incision was made overlying the fibroid.  The fibroid was then enucleated from its base.  Once this was done, it was then noted that the endometrial cavity  while not officially entered was clearly close by.  The defect was then closed with 0-V Lock running stitch in 2 layers followed by 2-0 V Lock suture for the subserosal surface. Good hemostasis was achieved.  The chromopertubation was then again performed and again no spillage noted.  At that point, the procedure was felt to be completed.  The abdomen was irrigated and suctioned.  The robot was undocked.  The assistant port was replaced by a Storz morcellated and the fibroid was then easily morcellated.  Pieces of fibroid were removed.  The uterus was irrigated.  The pelvis was inspected and good hemostasis noted.  Interceed was placed overlying the  anterior incision of the uterus.  The ports were then removed.  The instruments in the vagina was also removed, and the abdomen was deflated, incision was closed with Carter needle passer for the supraumbilical and the right lower quadrant sites and the incision was closed with 4-0 Vicryl subcuticular stitch.  SPECIMEN:  Morcellated fibroid sent to Pathology.  ESTIMATED BLOOD LOSS:  50 mL.  INTRAOPERATIVE FLUID:  1000 mL.  URINE OUTPUT:  250 mL clear urine.  Sponge and instrument counts x2 was correct.  COMPLICATIONS:  None.  The patient tolerated the procedure well, was transferred to recovery room in stable condition.     Maxie Better, M.D.     [place]/MEDQ  D:  09/22/2011  T:  09/23/2011  Job:  [id number]

## 2011-09-24 ENCOUNTER — Encounter (HOSPITAL_COMMUNITY): Payer: Self-pay | Admitting: Obstetrics and Gynecology

## 2011-09-24 NOTE — Progress Notes (Signed)
Post discharge review completed. 

## 2012-04-24 ENCOUNTER — Ambulatory Visit: Payer: BC Managed Care – PPO | Admitting: *Deleted

## 2013-07-23 ENCOUNTER — Other Ambulatory Visit: Payer: Self-pay | Admitting: Obstetrics and Gynecology

## 2013-09-07 ENCOUNTER — Ambulatory Visit: Payer: BC Managed Care – PPO | Admitting: *Deleted

## 2013-12-08 ENCOUNTER — Other Ambulatory Visit: Payer: Self-pay | Admitting: Obstetrics and Gynecology

## 2015-05-05 DIAGNOSIS — Z1231 Encounter for screening mammogram for malignant neoplasm of breast: Secondary | ICD-10-CM | POA: Diagnosis not present

## 2015-05-06 DIAGNOSIS — Z713 Dietary counseling and surveillance: Secondary | ICD-10-CM | POA: Diagnosis not present

## 2015-07-26 DIAGNOSIS — D649 Anemia, unspecified: Secondary | ICD-10-CM | POA: Diagnosis not present

## 2015-07-26 DIAGNOSIS — I10 Essential (primary) hypertension: Secondary | ICD-10-CM | POA: Diagnosis not present

## 2015-07-26 DIAGNOSIS — N760 Acute vaginitis: Secondary | ICD-10-CM | POA: Diagnosis not present

## 2015-11-07 DIAGNOSIS — Z23 Encounter for immunization: Secondary | ICD-10-CM | POA: Diagnosis not present

## 2015-11-15 DIAGNOSIS — Z713 Dietary counseling and surveillance: Secondary | ICD-10-CM | POA: Diagnosis not present

## 2015-11-15 DIAGNOSIS — N6320 Unspecified lump in the left breast, unspecified quadrant: Secondary | ICD-10-CM | POA: Diagnosis not present

## 2015-12-27 DIAGNOSIS — Z113 Encounter for screening for infections with a predominantly sexual mode of transmission: Secondary | ICD-10-CM | POA: Diagnosis not present

## 2015-12-27 DIAGNOSIS — Z01419 Encounter for gynecological examination (general) (routine) without abnormal findings: Secondary | ICD-10-CM | POA: Diagnosis not present

## 2015-12-27 DIAGNOSIS — Z13220 Encounter for screening for lipoid disorders: Secondary | ICD-10-CM | POA: Diagnosis not present

## 2015-12-27 DIAGNOSIS — Z1329 Encounter for screening for other suspected endocrine disorder: Secondary | ICD-10-CM | POA: Diagnosis not present

## 2015-12-27 DIAGNOSIS — R87612 Low grade squamous intraepithelial lesion on cytologic smear of cervix (LGSIL): Secondary | ICD-10-CM | POA: Diagnosis not present

## 2015-12-27 DIAGNOSIS — Z6831 Body mass index (BMI) 31.0-31.9, adult: Secondary | ICD-10-CM | POA: Diagnosis not present

## 2015-12-27 DIAGNOSIS — Z1151 Encounter for screening for human papillomavirus (HPV): Secondary | ICD-10-CM | POA: Diagnosis not present

## 2015-12-27 DIAGNOSIS — Z0000 Encounter for general adult medical examination without abnormal findings: Secondary | ICD-10-CM | POA: Diagnosis not present

## 2015-12-27 DIAGNOSIS — Z131 Encounter for screening for diabetes mellitus: Secondary | ICD-10-CM | POA: Diagnosis not present

## 2015-12-27 DIAGNOSIS — Z130 Encounter for screening for diseases of the blood and blood-forming organs and certain disorders involving the immune mechanism: Secondary | ICD-10-CM | POA: Diagnosis not present

## 2016-02-06 DIAGNOSIS — R87612 Low grade squamous intraepithelial lesion on cytologic smear of cervix (LGSIL): Secondary | ICD-10-CM | POA: Diagnosis not present

## 2016-02-22 DIAGNOSIS — D509 Iron deficiency anemia, unspecified: Secondary | ICD-10-CM | POA: Diagnosis not present

## 2016-03-02 ENCOUNTER — Other Ambulatory Visit: Payer: Self-pay | Admitting: Obstetrics and Gynecology

## 2016-03-12 ENCOUNTER — Emergency Department (HOSPITAL_BASED_OUTPATIENT_CLINIC_OR_DEPARTMENT_OTHER)
Admission: EM | Admit: 2016-03-12 | Discharge: 2016-03-12 | Disposition: A | Discharge status: Home / Self Care | Payer: BLUE CROSS/BLUE SHIELD | Attending: Emergency Medicine | Admitting: Emergency Medicine

## 2016-03-12 ENCOUNTER — Encounter (HOSPITAL_BASED_OUTPATIENT_CLINIC_OR_DEPARTMENT_OTHER): Payer: Self-pay | Admitting: *Deleted

## 2016-03-12 DIAGNOSIS — S63641A Sprain of metacarpophalangeal joint of right thumb, initial encounter: Secondary | ICD-10-CM | POA: Diagnosis not present

## 2016-03-12 DIAGNOSIS — Z79899 Other long term (current) drug therapy: Secondary | ICD-10-CM | POA: Diagnosis not present

## 2016-03-12 DIAGNOSIS — E119 Type 2 diabetes mellitus without complications: Secondary | ICD-10-CM | POA: Diagnosis not present

## 2016-03-12 DIAGNOSIS — Y9389 Activity, other specified: Secondary | ICD-10-CM | POA: Diagnosis not present

## 2016-03-12 DIAGNOSIS — S93402A Sprain of unspecified ligament of left ankle, initial encounter: Secondary | ICD-10-CM | POA: Diagnosis not present

## 2016-03-12 DIAGNOSIS — Y999 Unspecified external cause status: Secondary | ICD-10-CM | POA: Insufficient documentation

## 2016-03-12 DIAGNOSIS — S161XXA Strain of muscle, fascia and tendon at neck level, initial encounter: Secondary | ICD-10-CM | POA: Diagnosis not present

## 2016-03-12 DIAGNOSIS — T148XXA Other injury of unspecified body region, initial encounter: Secondary | ICD-10-CM

## 2016-03-12 DIAGNOSIS — Y929 Unspecified place or not applicable: Secondary | ICD-10-CM | POA: Diagnosis not present

## 2016-03-12 DIAGNOSIS — S199XXA Unspecified injury of neck, initial encounter: Secondary | ICD-10-CM | POA: Diagnosis present

## 2016-03-12 DIAGNOSIS — T07XXXA Unspecified multiple injuries, initial encounter: Secondary | ICD-10-CM | POA: Diagnosis not present

## 2016-03-12 MED ORDER — BACLOFEN 10 MG PO TABS: 10.0000 mg | ORAL_TABLET | Freq: Three times a day (TID) | ORAL | 0 refills | Status: AC

## 2016-03-12 MED ORDER — MELOXICAM 15 MG PO TABS: 15.0000 mg | ORAL_TABLET | Freq: Every day | ORAL | 0 refills | Status: AC
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
## 2016-03-12 NOTE — Discharge Instructions (Signed)
Contact a health care provider if:  You have symptoms that get worse or do not get better after 2 weeks of treatment.  You have pain that gets worse or does not get better with medicine.  You develop new, unexplained symptoms.  You have sores or irritated skin on your neck from wearing your cervical collar. Get help right away if:  You have severe pain.  You develop numbness, tingling, or weakness in any part of your body.  You cannot move a part of your body (you have paralysis).  You have neck pain along with: ? Severe dizziness. ? Headache.

## 2016-06-11 DIAGNOSIS — Z713 Dietary counseling and surveillance: Secondary | ICD-10-CM | POA: Diagnosis not present

## 2016-06-14 DIAGNOSIS — D649 Anemia, unspecified: Secondary | ICD-10-CM | POA: Diagnosis not present

## 2016-06-14 DIAGNOSIS — I10 Essential (primary) hypertension: Secondary | ICD-10-CM | POA: Diagnosis not present

## 2016-06-21 DIAGNOSIS — D509 Iron deficiency anemia, unspecified: Secondary | ICD-10-CM | POA: Diagnosis not present

## 2016-06-22 DIAGNOSIS — L810 Postinflammatory hyperpigmentation: Secondary | ICD-10-CM | POA: Diagnosis not present

## 2016-08-14 DIAGNOSIS — Z1231 Encounter for screening mammogram for malignant neoplasm of breast: Secondary | ICD-10-CM | POA: Diagnosis not present

## 2016-09-14 DIAGNOSIS — D509 Iron deficiency anemia, unspecified: Secondary | ICD-10-CM | POA: Diagnosis not present

## 2016-09-14 DIAGNOSIS — Z3202 Encounter for pregnancy test, result negative: Secondary | ICD-10-CM | POA: Diagnosis not present

## 2016-11-28 DIAGNOSIS — Z23 Encounter for immunization: Secondary | ICD-10-CM | POA: Diagnosis not present

## 2016-12-17 DIAGNOSIS — D649 Anemia, unspecified: Secondary | ICD-10-CM | POA: Diagnosis not present

## 2016-12-17 DIAGNOSIS — I10 Essential (primary) hypertension: Secondary | ICD-10-CM | POA: Diagnosis not present

## 2017-01-02 DIAGNOSIS — Z1159 Encounter for screening for other viral diseases: Secondary | ICD-10-CM | POA: Diagnosis not present

## 2017-01-02 DIAGNOSIS — R87612 Low grade squamous intraepithelial lesion on cytologic smear of cervix (LGSIL): Secondary | ICD-10-CM | POA: Diagnosis not present

## 2017-01-02 DIAGNOSIS — Z6833 Body mass index (BMI) 33.0-33.9, adult: Secondary | ICD-10-CM | POA: Diagnosis not present

## 2017-01-02 DIAGNOSIS — Z130 Encounter for screening for diseases of the blood and blood-forming organs and certain disorders involving the immune mechanism: Secondary | ICD-10-CM | POA: Diagnosis not present

## 2017-01-02 DIAGNOSIS — Z13220 Encounter for screening for lipoid disorders: Secondary | ICD-10-CM | POA: Diagnosis not present

## 2017-01-02 DIAGNOSIS — Z118 Encounter for screening for other infectious and parasitic diseases: Secondary | ICD-10-CM | POA: Diagnosis not present

## 2017-01-02 DIAGNOSIS — Z1329 Encounter for screening for other suspected endocrine disorder: Secondary | ICD-10-CM | POA: Diagnosis not present

## 2017-01-02 DIAGNOSIS — Z0000 Encounter for general adult medical examination without abnormal findings: Secondary | ICD-10-CM | POA: Diagnosis not present

## 2017-01-02 DIAGNOSIS — Z01419 Encounter for gynecological examination (general) (routine) without abnormal findings: Secondary | ICD-10-CM | POA: Diagnosis not present

## 2017-01-02 DIAGNOSIS — Z1151 Encounter for screening for human papillomavirus (HPV): Secondary | ICD-10-CM | POA: Diagnosis not present

## 2017-01-02 DIAGNOSIS — Z131 Encounter for screening for diabetes mellitus: Secondary | ICD-10-CM | POA: Diagnosis not present

## 2017-04-29 DIAGNOSIS — Z713 Dietary counseling and surveillance: Secondary | ICD-10-CM | POA: Diagnosis not present

## 2017-05-30 DIAGNOSIS — Z713 Dietary counseling and surveillance: Secondary | ICD-10-CM | POA: Diagnosis not present

## 2017-07-11 DIAGNOSIS — Z713 Dietary counseling and surveillance: Secondary | ICD-10-CM | POA: Diagnosis not present

## 2017-08-22 DIAGNOSIS — Z713 Dietary counseling and surveillance: Secondary | ICD-10-CM | POA: Diagnosis not present

## 2017-09-16 DIAGNOSIS — J209 Acute bronchitis, unspecified: Secondary | ICD-10-CM | POA: Diagnosis not present

## 2017-10-03 DIAGNOSIS — Z713 Dietary counseling and surveillance: Secondary | ICD-10-CM | POA: Diagnosis not present

## 2017-10-08 DIAGNOSIS — Z23 Encounter for immunization: Secondary | ICD-10-CM | POA: Diagnosis not present

## 2017-10-25 ENCOUNTER — Encounter (HOSPITAL_COMMUNITY): Payer: Self-pay | Admitting: Family Medicine

## 2017-10-25 ENCOUNTER — Other Ambulatory Visit: Payer: Self-pay

## 2017-10-25 ENCOUNTER — Ambulatory Visit (HOSPITAL_COMMUNITY)
Admission: EM | Admit: 2017-10-25 | Discharge: 2017-10-25 | Disposition: A | Discharge status: Home / Self Care | Payer: BLUE CROSS/BLUE SHIELD | Attending: Family Medicine | Admitting: Family Medicine

## 2017-10-25 ENCOUNTER — Ambulatory Visit (INDEPENDENT_AMBULATORY_CARE_PROVIDER_SITE_OTHER): Payer: BLUE CROSS/BLUE SHIELD

## 2017-10-25 DIAGNOSIS — R1010 Upper abdominal pain, unspecified: Secondary | ICD-10-CM

## 2017-10-25 DIAGNOSIS — K5900 Constipation, unspecified: Secondary | ICD-10-CM

## 2017-10-25 DIAGNOSIS — Z3202 Encounter for pregnancy test, result negative: Secondary | ICD-10-CM | POA: Diagnosis not present

## 2017-10-25 DIAGNOSIS — R109 Unspecified abdominal pain: Secondary | ICD-10-CM | POA: Diagnosis not present

## 2017-10-25 LAB — POCT URINALYSIS DIP (DEVICE)
Bilirubin Urine: NEGATIVE
Glucose, UA: NEGATIVE mg/dL
Hgb urine dipstick: NEGATIVE
Ketones, ur: NEGATIVE mg/dL
Leukocytes, UA: NEGATIVE
Nitrite: NEGATIVE
Protein, ur: NEGATIVE mg/dL
Specific Gravity, Urine: 1.015 (ref 1.005–1.030)
Urobilinogen, UA: 0.2 mg/dL (ref 0.0–1.0)
pH: 6 (ref 5.0–8.0)

## 2017-10-25 LAB — POCT PREGNANCY, URINE: Preg Test, Ur: NEGATIVE

## 2017-10-25 MED ORDER — MAGNESIUM CITRATE PO SOLN: 1.0000 | Freq: Once | ORAL | 1 refills | Status: AC

## 2017-10-25 NOTE — Discharge Instructions (Addendum)
Your x-ray revealed constipation We will give you mag citrate to treat Follow up as needed for continued or worsening symptoms

## 2017-10-25 NOTE — ED Provider Notes (Signed)
[place]    CSN: [id number] Arrival date & time: 10/25/17  0848     History   Chief Complaint Chief Complaint  Patient presents with  . Abdominal Pain    HPI Brianna [name] is a 42 y.o. female.   Patient is a 42 year old female that presents with left flank, rib, upper abdominal discomfort that has been intermittent for the past 3 days.  She describes it as a pulling discomfort.  Prior to this she admits to sleeping on the floor with body twisted onto a futon.  Symptoms started shortly after that.  She also is having some constipations and difficult to have a bowel movement.  She reports a small bowel movement this morning.  Denies any associated nausea, vomiting, diarrhea, fever, chills, body aches, coughing, congestion, chest pain, shortness of breath.  Patient has history of cholecystectomy.  She does not smoke or drink any alcohol  ROS per HPI      Past Medical History:  Diagnosis Date  . Anemia   . Diabetes mellitus     "borderline" per pt-diet controlled    There are no active problems to display for this patient.   Past Surgical History:  Procedure Laterality Date  . ABDOMINAL SURGERY    . CESAREAN SECTION    . CHOLECYSTECTOMY    . ROBOT ASSISTED MYOMECTOMY  09/21/2011   Procedure: ROBOTIC ASSISTED MYOMECTOMY;  Surgeon: Marvene Staff, MD;  Location: [facility];  Service: Gynecology;  Laterality: N/A;  With possible Tuboplasty.  Requests 3 way Foley.  3 hrs./chromopertubation    OB History   None      Home Medications    Prior to Admission medications   Medication Sig Start Date End Date Taking? Authorizing Provider  baclofen (LIORESAL) 10 MG tablet Take 1 tablet (10 mg total) by mouth 3 (three) times daily. 03/12/16   Margarita Mail, PA-C  cholecalciferol (VITAMIN D) 1000 units tablet Take 1,000 Units by mouth daily.    [provider]  FeFum-FePoly-FA-B Cmp-C-Biot (INTEGRA PLUS PO) Take 1 capsule by mouth daily.     [provider]  folic acid (FOLVITE) 0.5 MG tablet Take 0.5 mg by mouth daily.    [provider]  losartan (COZAAR) 50 MG tablet Take 50 mg by mouth daily.    [provider]  magnesium citrate SOLN Take 296 mLs (1 Bottle total) by mouth once for 1 dose. 10/25/17 10/25/17  Loura Halt A, NP  meloxicam (MOBIC) 15 MG tablet Take 1 tablet (15 mg total) by mouth daily. Take 1 daily with food. Patient not taking: Reported on 10/25/2017 03/12/16   Margarita Mail, PA-C    Family History No family history on file.  Social History Social History   Tobacco Use  . Smoking status: Never Smoker  . Smokeless tobacco: Never Used  Substance Use Topics  . Alcohol use: No  . Drug use: No     Allergies   Patient has no known allergies.   Review of Systems Review of Systems   Physical Exam Triage Vital Signs ED Triage Vitals  Enc Vitals Group     BP 10/25/17 0905 130/85     Pulse Rate 10/25/17 0905 74     Resp 10/25/17 0905 18     Temp 10/25/17 0905 98.3 F (36.8 C)     Temp src --      SpO2 10/25/17 0905 100 %     Weight 10/25/17 0906 196 lb (88.9 kg)  Height --      Head Circumference --      Peak Flow --      Pain Score 10/25/17 0906 5     Pain Loc --      Pain Edu? --      Excl. in [place]? --    No data found.  Updated Vital Signs BP 130/85 (BP Location: Right Arm)   Pulse 74   Temp 98.3 F (36.8 C)   Resp 18   Wt 196 lb (88.9 kg)   LMP 09/02/2017 (Exact Date) Comment: pregnancy test: Negative  SpO2 100%   BMI 33.64 kg/m   Visual Acuity Right Eye Distance:   Left Eye Distance:   Bilateral Distance:    Right Eye Near:   Left Eye Near:    Bilateral Near:     Physical Exam  Constitutional: She appears well-developed and well-nourished.  Very pleasant. Non toxic or ill appearing.     HENT:  Head: Normocephalic and atraumatic.  Pulmonary/Chest: Effort normal.  Abdominal: Soft. Normal appearance, normal aorta and bowel sounds are  normal. There is tenderness in the right upper quadrant and left upper quadrant.  Mild tenderness to upper abdomen. No lower abdominal tenderness. No rebound or masses.   Neurological: She is alert.  Skin: Skin is warm and dry. Capillary refill takes less than 2 seconds.  Psychiatric: She has a normal mood and affect.  Nursing note and vitals reviewed.    UC Treatments / Results  Labs (all labs ordered are listed, but only abnormal results are displayed) Labs Reviewed  POCT URINALYSIS DIP (DEVICE)  POCT PREGNANCY, URINE    EKG None  Radiology Dg Abd 1 View  Result Date: 10/25/2017 CLINICAL DATA:  Left side pain EXAM: ABDOMEN - 1 VIEW COMPARISON:  None. FINDINGS: Moderate stool burden throughout the colon, in particular in the right colon. Prior cholecystectomy. No evidence of bowel obstruction or free air. No organomegaly or suspicious calcification. No acute bony abnormality. IMPRESSION: Moderate stool burden.  No acute findings. Prior cholecystectomy. Electronically Signed   By: Rolm Baptise M.D.   On: 10/25/2017 09:49    Procedures Procedures (including critical care time)  Medications Ordered in UC Medications - No data to display  Initial Impression / Assessment and Plan / UC Course  I have reviewed the triage vital signs and the nursing notes.  Pertinent labs & imaging results that were available during my care of the patient were reviewed by me and considered in my medical decision making (see chart for details).    Urine negative for infection or pregnancy X-ray revealed constipation We will treat with mag citrate Follow-up as needed Final Clinical Impressions(s) / UC Diagnoses   Final diagnoses:  Constipation, unspecified constipation type     Discharge Instructions     Your x-ray revealed constipation We will give you mag citrate to treat Follow up as needed for continued or worsening symptoms     ED Prescriptions    Medication Sig Dispense Auth.  Provider   magnesium citrate SOLN Take 296 mLs (1 Bottle total) by mouth once for 1 dose. 195 mL Loura Halt A, NP     Controlled Substance Prescriptions [place] Controlled Substance Registry consulted? Not Applicable   Orvan July, NP 10/25/17 1001    Wynona Luna, MD 11/05/17 1540

## 2017-10-25 NOTE — ED Triage Notes (Signed)
Pt states she has a abdominal pain like a spasm x 3days

## 2018-01-10 DIAGNOSIS — I10 Essential (primary) hypertension: Secondary | ICD-10-CM | POA: Diagnosis not present

## 2018-01-10 DIAGNOSIS — D649 Anemia, unspecified: Secondary | ICD-10-CM | POA: Diagnosis not present

## 2018-01-24 DIAGNOSIS — Z1231 Encounter for screening mammogram for malignant neoplasm of breast: Secondary | ICD-10-CM | POA: Diagnosis not present

## 2018-01-24 DIAGNOSIS — Z6834 Body mass index (BMI) 34.0-34.9, adult: Secondary | ICD-10-CM | POA: Diagnosis not present

## 2018-01-24 DIAGNOSIS — Z01419 Encounter for gynecological examination (general) (routine) without abnormal findings: Secondary | ICD-10-CM | POA: Diagnosis not present

## 2018-01-24 DIAGNOSIS — Z113 Encounter for screening for infections with a predominantly sexual mode of transmission: Secondary | ICD-10-CM | POA: Diagnosis not present

## 2018-01-24 DIAGNOSIS — Z124 Encounter for screening for malignant neoplasm of cervix: Secondary | ICD-10-CM | POA: Diagnosis not present

## 2018-01-24 DIAGNOSIS — Z1151 Encounter for screening for human papillomavirus (HPV): Secondary | ICD-10-CM | POA: Diagnosis not present

## 2018-03-13 ENCOUNTER — Ambulatory Visit (HOSPITAL_COMMUNITY)
Admission: RE | Admit: 2018-03-13 | Discharge: 2018-03-13 | Disposition: A | Discharge status: Home / Self Care | Payer: BLUE CROSS/BLUE SHIELD | Source: Ambulatory Visit | Attending: Family Medicine | Admitting: Family Medicine

## 2018-03-13 ENCOUNTER — Other Ambulatory Visit: Payer: Self-pay | Admitting: Family Medicine

## 2018-03-13 ENCOUNTER — Other Ambulatory Visit (HOSPITAL_COMMUNITY): Payer: Self-pay | Admitting: Family Medicine

## 2018-03-13 DIAGNOSIS — R1010 Upper abdominal pain, unspecified: Secondary | ICD-10-CM | POA: Diagnosis not present

## 2018-03-13 DIAGNOSIS — N281 Cyst of kidney, acquired: Secondary | ICD-10-CM | POA: Diagnosis not present

## 2018-03-13 DIAGNOSIS — N200 Calculus of kidney: Secondary | ICD-10-CM | POA: Diagnosis not present

## 2018-03-13 DIAGNOSIS — N201 Calculus of ureter: Secondary | ICD-10-CM | POA: Diagnosis not present

## 2018-06-30 DIAGNOSIS — R112 Nausea with vomiting, unspecified: Secondary | ICD-10-CM | POA: Diagnosis not present

## 2018-06-30 DIAGNOSIS — R197 Diarrhea, unspecified: Secondary | ICD-10-CM | POA: Diagnosis not present

## 2018-06-30 DIAGNOSIS — I10 Essential (primary) hypertension: Secondary | ICD-10-CM | POA: Diagnosis not present

## 2018-07-01 DIAGNOSIS — R197 Diarrhea, unspecified: Secondary | ICD-10-CM | POA: Diagnosis not present

## 2018-07-23 DIAGNOSIS — Z0000 Encounter for general adult medical examination without abnormal findings: Secondary | ICD-10-CM | POA: Diagnosis not present

## 2018-12-10 DIAGNOSIS — I10 Essential (primary) hypertension: Secondary | ICD-10-CM | POA: Diagnosis not present

## 2018-12-10 DIAGNOSIS — D649 Anemia, unspecified: Secondary | ICD-10-CM | POA: Diagnosis not present

## 2018-12-10 DIAGNOSIS — B354 Tinea corporis: Secondary | ICD-10-CM | POA: Diagnosis not present

## 2018-12-10 DIAGNOSIS — Z23 Encounter for immunization: Secondary | ICD-10-CM | POA: Diagnosis not present

## 2018-12-10 DIAGNOSIS — Z833 Family history of diabetes mellitus: Secondary | ICD-10-CM | POA: Diagnosis not present

## 2019-01-21 DIAGNOSIS — F4321 Adjustment disorder with depressed mood: Secondary | ICD-10-CM | POA: Diagnosis not present

## 2019-01-21 DIAGNOSIS — D649 Anemia, unspecified: Secondary | ICD-10-CM | POA: Diagnosis not present

## 2019-01-21 DIAGNOSIS — I10 Essential (primary) hypertension: Secondary | ICD-10-CM | POA: Diagnosis not present

## 2019-02-04 DIAGNOSIS — R109 Unspecified abdominal pain: Secondary | ICD-10-CM | POA: Diagnosis not present

## 2019-02-04 DIAGNOSIS — R87610 Atypical squamous cells of undetermined significance on cytologic smear of cervix (ASC-US): Secondary | ICD-10-CM | POA: Diagnosis not present

## 2019-02-04 DIAGNOSIS — Z124 Encounter for screening for malignant neoplasm of cervix: Secondary | ICD-10-CM | POA: Diagnosis not present

## 2019-02-04 DIAGNOSIS — Z6835 Body mass index (BMI) 35.0-35.9, adult: Secondary | ICD-10-CM | POA: Diagnosis not present

## 2019-02-04 DIAGNOSIS — Z01419 Encounter for gynecological examination (general) (routine) without abnormal findings: Secondary | ICD-10-CM | POA: Diagnosis not present

## 2019-02-04 DIAGNOSIS — N926 Irregular menstruation, unspecified: Secondary | ICD-10-CM | POA: Diagnosis not present

## 2019-02-04 DIAGNOSIS — N920 Excessive and frequent menstruation with regular cycle: Secondary | ICD-10-CM | POA: Diagnosis not present

## 2019-02-13 DIAGNOSIS — Z1231 Encounter for screening mammogram for malignant neoplasm of breast: Secondary | ICD-10-CM | POA: Diagnosis not present

## 2019-04-01 DIAGNOSIS — R0789 Other chest pain: Secondary | ICD-10-CM | POA: Diagnosis not present

## 2019-06-11 IMAGING — DX DG ABDOMEN 1V
1 series · 1 of 1 positions shown · non-contrast
Comparison: None.

CLINICAL DATA: Left side pain

EXAM:
ABDOMEN - 1 VIEW

[abdomen kub]
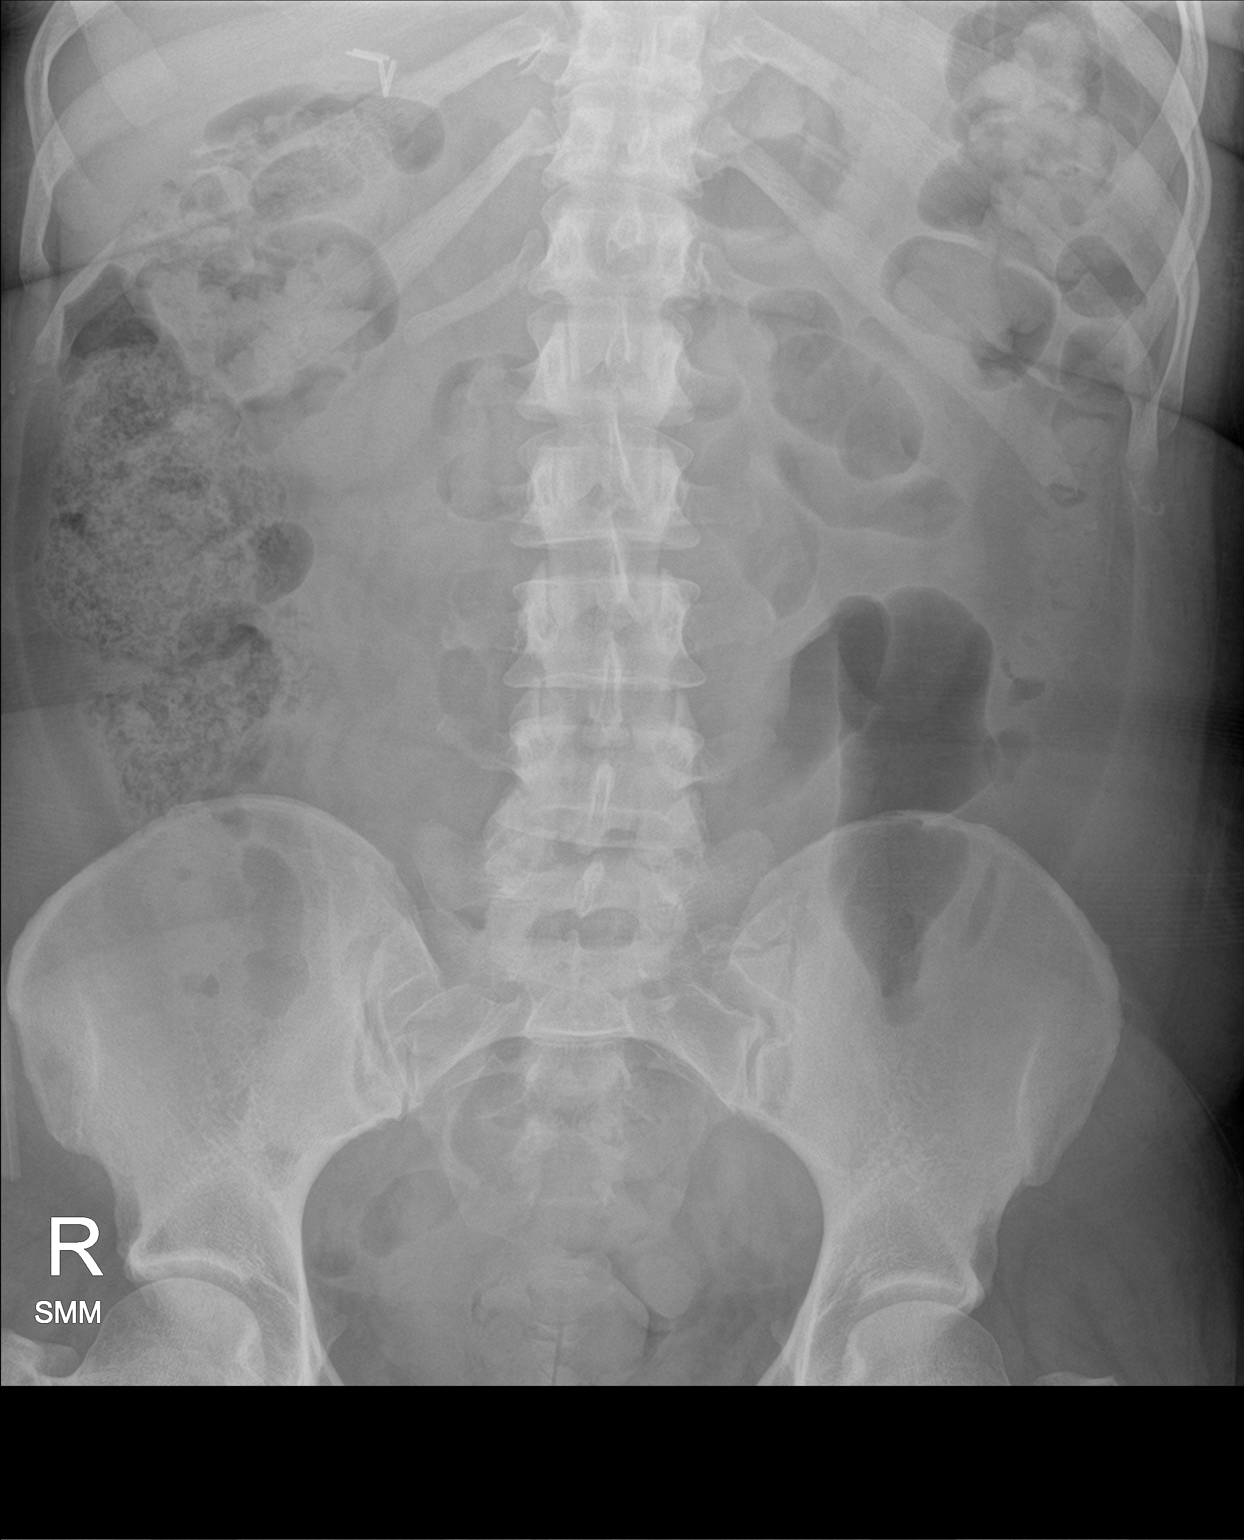

[1 of 1 positions shown; findings below may reference images not displayed]

FINDINGS: Moderate stool burden throughout the colon, in particular in the
right colon. Prior cholecystectomy. No evidence of bowel obstruction
or free air. No organomegaly or suspicious calcification. No acute
bony abnormality.
IMPRESSION: Moderate stool burden.  No acute findings.

Prior cholecystectomy.

## 2020-01-04 DIAGNOSIS — Z20822 Contact with and (suspected) exposure to covid-19: Secondary | ICD-10-CM | POA: Diagnosis not present

## 2020-01-04 DIAGNOSIS — Z03818 Encounter for observation for suspected exposure to other biological agents ruled out: Secondary | ICD-10-CM | POA: Diagnosis not present

## 2020-02-09 DIAGNOSIS — Z20822 Contact with and (suspected) exposure to covid-19: Secondary | ICD-10-CM | POA: Diagnosis not present

## 2020-02-17 DIAGNOSIS — Z6836 Body mass index (BMI) 36.0-36.9, adult: Secondary | ICD-10-CM | POA: Diagnosis not present

## 2020-02-17 DIAGNOSIS — Z01419 Encounter for gynecological examination (general) (routine) without abnormal findings: Secondary | ICD-10-CM | POA: Diagnosis not present

## 2020-02-17 DIAGNOSIS — R87810 Cervical high risk human papillomavirus (HPV) DNA test positive: Secondary | ICD-10-CM | POA: Diagnosis not present

## 2020-02-17 DIAGNOSIS — Z1231 Encounter for screening mammogram for malignant neoplasm of breast: Secondary | ICD-10-CM | POA: Diagnosis not present

## 2020-03-25 DIAGNOSIS — Z1211 Encounter for screening for malignant neoplasm of colon: Secondary | ICD-10-CM | POA: Diagnosis not present

## 2020-03-25 DIAGNOSIS — K5900 Constipation, unspecified: Secondary | ICD-10-CM | POA: Diagnosis not present

## 2020-03-25 DIAGNOSIS — D509 Iron deficiency anemia, unspecified: Secondary | ICD-10-CM | POA: Diagnosis not present

## 2020-03-25 DIAGNOSIS — K649 Unspecified hemorrhoids: Secondary | ICD-10-CM | POA: Diagnosis not present

## 2020-04-05 DIAGNOSIS — Z1329 Encounter for screening for other suspected endocrine disorder: Secondary | ICD-10-CM | POA: Diagnosis not present

## 2020-04-05 DIAGNOSIS — Z13220 Encounter for screening for lipoid disorders: Secondary | ICD-10-CM | POA: Diagnosis not present

## 2020-04-05 DIAGNOSIS — D649 Anemia, unspecified: Secondary | ICD-10-CM | POA: Diagnosis not present

## 2020-04-05 DIAGNOSIS — Z131 Encounter for screening for diabetes mellitus: Secondary | ICD-10-CM | POA: Diagnosis not present

## 2020-04-05 DIAGNOSIS — D259 Leiomyoma of uterus, unspecified: Secondary | ICD-10-CM | POA: Diagnosis not present

## 2020-04-05 DIAGNOSIS — Z0000 Encounter for general adult medical examination without abnormal findings: Secondary | ICD-10-CM | POA: Diagnosis not present

## 2020-04-05 DIAGNOSIS — N920 Excessive and frequent menstruation with regular cycle: Secondary | ICD-10-CM | POA: Diagnosis not present

## 2020-04-15 DIAGNOSIS — D509 Iron deficiency anemia, unspecified: Secondary | ICD-10-CM | POA: Diagnosis not present

## 2020-04-19 DIAGNOSIS — Z0000 Encounter for general adult medical examination without abnormal findings: Secondary | ICD-10-CM | POA: Diagnosis not present

## 2020-04-19 DIAGNOSIS — F418 Other specified anxiety disorders: Secondary | ICD-10-CM | POA: Diagnosis not present

## 2020-04-19 DIAGNOSIS — I10 Essential (primary) hypertension: Secondary | ICD-10-CM | POA: Diagnosis not present

## 2020-04-19 DIAGNOSIS — Z23 Encounter for immunization: Secondary | ICD-10-CM | POA: Diagnosis not present

## 2020-04-19 DIAGNOSIS — E669 Obesity, unspecified: Secondary | ICD-10-CM | POA: Diagnosis not present

## 2020-04-25 DIAGNOSIS — Z30430 Encounter for insertion of intrauterine contraceptive device: Secondary | ICD-10-CM | POA: Diagnosis not present

## 2020-04-25 DIAGNOSIS — Z3202 Encounter for pregnancy test, result negative: Secondary | ICD-10-CM | POA: Diagnosis not present

## 2020-05-03 DIAGNOSIS — R109 Unspecified abdominal pain: Secondary | ICD-10-CM | POA: Diagnosis not present

## 2020-05-23 DIAGNOSIS — D125 Benign neoplasm of sigmoid colon: Secondary | ICD-10-CM | POA: Diagnosis not present

## 2020-05-23 DIAGNOSIS — D124 Benign neoplasm of descending colon: Secondary | ICD-10-CM | POA: Diagnosis not present

## 2020-05-23 DIAGNOSIS — D509 Iron deficiency anemia, unspecified: Secondary | ICD-10-CM | POA: Diagnosis not present

## 2020-05-23 DIAGNOSIS — Z1211 Encounter for screening for malignant neoplasm of colon: Secondary | ICD-10-CM | POA: Diagnosis not present

## 2020-06-14 DIAGNOSIS — D649 Anemia, unspecified: Secondary | ICD-10-CM | POA: Diagnosis not present

## 2020-06-14 DIAGNOSIS — Z30431 Encounter for routine checking of intrauterine contraceptive device: Secondary | ICD-10-CM | POA: Diagnosis not present

## 2020-06-14 DIAGNOSIS — N939 Abnormal uterine and vaginal bleeding, unspecified: Secondary | ICD-10-CM | POA: Diagnosis not present

## 2020-06-17 ENCOUNTER — Emergency Department: Payer: BC Managed Care – PPO

## 2020-06-17 ENCOUNTER — Other Ambulatory Visit: Payer: Self-pay

## 2020-06-17 ENCOUNTER — Encounter: Payer: Self-pay | Admitting: Emergency Medicine

## 2020-06-17 ENCOUNTER — Emergency Department
Admission: EM | Admit: 2020-06-17 | Discharge: 2020-06-17 | Disposition: A | Discharge status: Home / Self Care | Payer: BC Managed Care – PPO | Attending: Emergency Medicine | Admitting: Emergency Medicine

## 2020-06-17 DIAGNOSIS — Z20822 Contact with and (suspected) exposure to covid-19: Secondary | ICD-10-CM | POA: Diagnosis not present

## 2020-06-17 DIAGNOSIS — S060X1A Concussion with loss of consciousness of 30 minutes or less, initial encounter: Secondary | ICD-10-CM | POA: Insufficient documentation

## 2020-06-17 DIAGNOSIS — S0101XA Laceration without foreign body of scalp, initial encounter: Secondary | ICD-10-CM | POA: Insufficient documentation

## 2020-06-17 DIAGNOSIS — Z79899 Other long term (current) drug therapy: Secondary | ICD-10-CM | POA: Insufficient documentation

## 2020-06-17 DIAGNOSIS — R55 Syncope and collapse: Secondary | ICD-10-CM

## 2020-06-17 DIAGNOSIS — I10 Essential (primary) hypertension: Secondary | ICD-10-CM | POA: Diagnosis not present

## 2020-06-17 DIAGNOSIS — Y92009 Unspecified place in unspecified non-institutional (private) residence as the place of occurrence of the external cause: Secondary | ICD-10-CM | POA: Diagnosis not present

## 2020-06-17 DIAGNOSIS — E119 Type 2 diabetes mellitus without complications: Secondary | ICD-10-CM | POA: Diagnosis not present

## 2020-06-17 DIAGNOSIS — W01198A Fall on same level from slipping, tripping and stumbling with subsequent striking against other object, initial encounter: Secondary | ICD-10-CM | POA: Insufficient documentation

## 2020-06-17 DIAGNOSIS — J323 Chronic sphenoidal sinusitis: Secondary | ICD-10-CM | POA: Diagnosis not present

## 2020-06-17 DIAGNOSIS — S0990XA Unspecified injury of head, initial encounter: Secondary | ICD-10-CM | POA: Diagnosis not present

## 2020-06-17 HISTORY — DX: Essential (primary) hypertension: I10

## 2020-06-17 LAB — POC SARS CORONAVIRUS 2 AG -  ED: SARS Coronavirus 2 Ag: NEGATIVE

## 2020-06-17 MED ORDER — LIDOCAINE-EPINEPHRINE-TETRACAINE (LET) TOPICAL GEL
3.0000 mL | Freq: Once | TOPICAL | Status: AC
  Administered 2020-06-17: 3 mL via TOPICAL
  Filled 2020-06-17: qty 3

## 2020-06-17 NOTE — ED Notes (Signed)
POC Rapid COVID= Negative
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
## 2020-06-17 NOTE — ED Notes (Signed)
Pt states that she has had covid symptoms since Wednesday; "fatigue, heavy chest pain, SOB"; took the at home covid test and said her head hurt then fell.

## 2020-06-17 NOTE — ED Notes (Signed)
ED signature pad: pt unable to sign

## 2020-06-17 NOTE — ED Triage Notes (Signed)
Pt states was doing an at home covid test and had an at home syncopal episode, fell and hit her head, pt with laceration to R side of her head. Pt presents A&O x4, ambulatory to treatment room at this time.

## 2020-06-19 DIAGNOSIS — U071 COVID-19: Secondary | ICD-10-CM | POA: Diagnosis not present

## 2020-06-19 DIAGNOSIS — Z20822 Contact with and (suspected) exposure to covid-19: Secondary | ICD-10-CM | POA: Diagnosis not present

## 2020-07-04 DIAGNOSIS — R509 Fever, unspecified: Secondary | ICD-10-CM | POA: Diagnosis not present

## 2020-07-04 DIAGNOSIS — R109 Unspecified abdominal pain: Secondary | ICD-10-CM | POA: Diagnosis not present

## 2020-07-04 DIAGNOSIS — R112 Nausea with vomiting, unspecified: Secondary | ICD-10-CM | POA: Diagnosis not present

## 2020-07-04 DIAGNOSIS — R197 Diarrhea, unspecified: Secondary | ICD-10-CM | POA: Diagnosis not present

## 2020-07-04 DIAGNOSIS — Z03818 Encounter for observation for suspected exposure to other biological agents ruled out: Secondary | ICD-10-CM | POA: Diagnosis not present

## 2020-08-26 DIAGNOSIS — R42 Dizziness and giddiness: Secondary | ICD-10-CM | POA: Diagnosis not present

## 2020-08-26 DIAGNOSIS — I10 Essential (primary) hypertension: Secondary | ICD-10-CM | POA: Diagnosis not present

## 2020-08-26 DIAGNOSIS — S060X9D Concussion with loss of consciousness of unspecified duration, subsequent encounter: Secondary | ICD-10-CM | POA: Diagnosis not present

## 2020-08-26 DIAGNOSIS — D649 Anemia, unspecified: Secondary | ICD-10-CM | POA: Diagnosis not present

## 2020-09-14 DIAGNOSIS — N920 Excessive and frequent menstruation with regular cycle: Secondary | ICD-10-CM | POA: Diagnosis not present

## 2020-10-19 DIAGNOSIS — Z713 Dietary counseling and surveillance: Secondary | ICD-10-CM | POA: Diagnosis not present

## 2021-02-27 DIAGNOSIS — R0989 Other specified symptoms and signs involving the circulatory and respiratory systems: Secondary | ICD-10-CM | POA: Diagnosis not present

## 2021-02-27 DIAGNOSIS — I10 Essential (primary) hypertension: Secondary | ICD-10-CM | POA: Diagnosis not present

## 2021-02-27 DIAGNOSIS — R059 Cough, unspecified: Secondary | ICD-10-CM | POA: Diagnosis not present

## 2021-05-18 DIAGNOSIS — K5900 Constipation, unspecified: Secondary | ICD-10-CM | POA: Diagnosis not present

## 2021-05-18 DIAGNOSIS — R079 Chest pain, unspecified: Secondary | ICD-10-CM | POA: Diagnosis not present

## 2021-05-18 DIAGNOSIS — S29011A Strain of muscle and tendon of front wall of thorax, initial encounter: Secondary | ICD-10-CM | POA: Diagnosis not present

## 2021-06-01 DIAGNOSIS — Z124 Encounter for screening for malignant neoplasm of cervix: Secondary | ICD-10-CM | POA: Diagnosis not present

## 2021-06-01 DIAGNOSIS — Z6835 Body mass index (BMI) 35.0-35.9, adult: Secondary | ICD-10-CM | POA: Diagnosis not present

## 2021-06-01 DIAGNOSIS — Z1231 Encounter for screening mammogram for malignant neoplasm of breast: Secondary | ICD-10-CM | POA: Diagnosis not present

## 2021-06-01 DIAGNOSIS — Z01419 Encounter for gynecological examination (general) (routine) without abnormal findings: Secondary | ICD-10-CM | POA: Diagnosis not present

## 2021-06-01 DIAGNOSIS — R87610 Atypical squamous cells of undetermined significance on cytologic smear of cervix (ASC-US): Secondary | ICD-10-CM | POA: Diagnosis not present

## 2021-07-14 DIAGNOSIS — F418 Other specified anxiety disorders: Secondary | ICD-10-CM | POA: Diagnosis not present

## 2021-07-14 DIAGNOSIS — Z0000 Encounter for general adult medical examination without abnormal findings: Secondary | ICD-10-CM | POA: Diagnosis not present

## 2021-07-14 DIAGNOSIS — D649 Anemia, unspecified: Secondary | ICD-10-CM | POA: Diagnosis not present

## 2021-07-14 DIAGNOSIS — I10 Essential (primary) hypertension: Secondary | ICD-10-CM | POA: Diagnosis not present

## 2021-07-14 DIAGNOSIS — Z13220 Encounter for screening for lipoid disorders: Secondary | ICD-10-CM | POA: Diagnosis not present

## 2021-07-14 DIAGNOSIS — E669 Obesity, unspecified: Secondary | ICD-10-CM | POA: Diagnosis not present

## 2021-10-20 DIAGNOSIS — Z713 Dietary counseling and surveillance: Secondary | ICD-10-CM | POA: Diagnosis not present

## 2022-02-01 IMAGING — CT CT HEAD W/O CM
3 series · 16 of 47 positions shown, 19 images · non-contrast
Comparison: Head CT 02/14/2003.

CLINICAL DATA: 45-year-old female status post fall this morning.
Right parietal injury.

EXAM:
CT HEAD WITHOUT CONTRAST
TECHNIQUE: Contiguous axial images were obtained from the base of the skull
through the vertex without intravenous contrast.

[Series 2: head wo · axial · 0.42mm/px · z∈[-79,+46]mm · 10 of 31 slices shown, 13 images]
[im 3/31  brain]
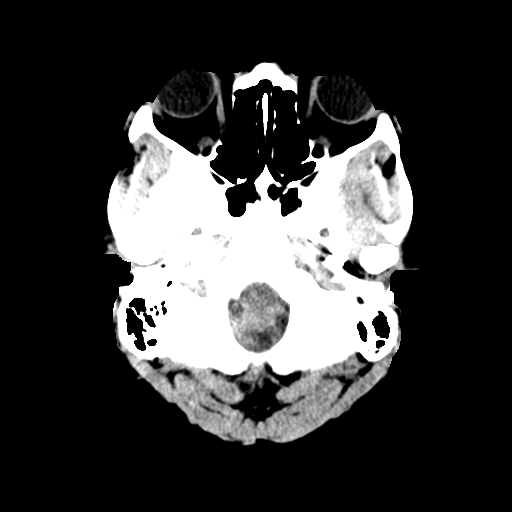
[im 3/31  bone]
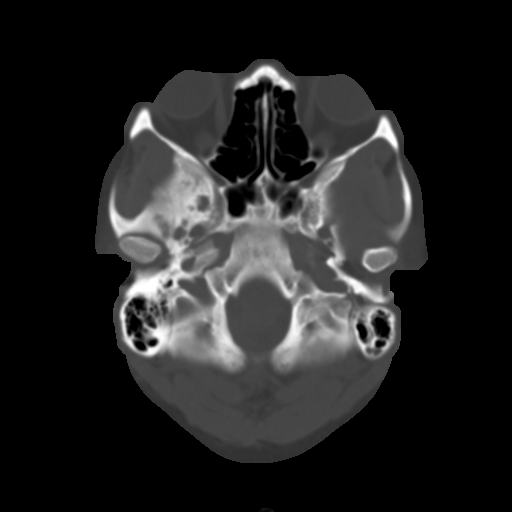
[im 6/31  brain]
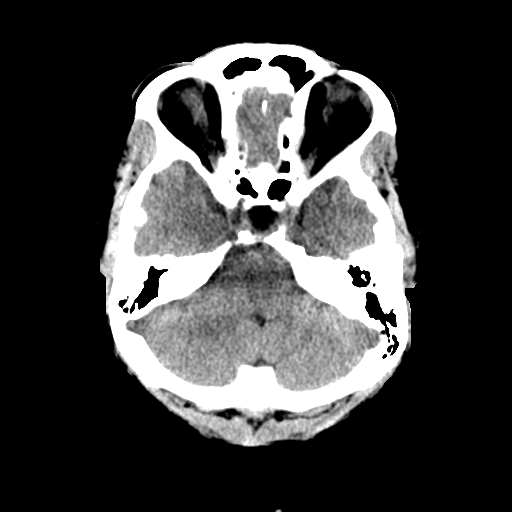
[im 9/31  brain]
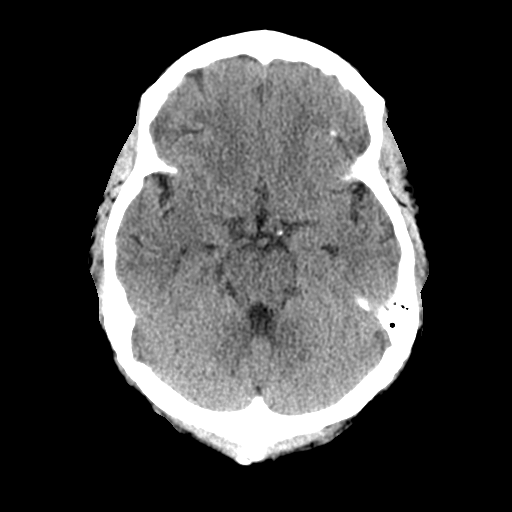
[im 11/31  brain]
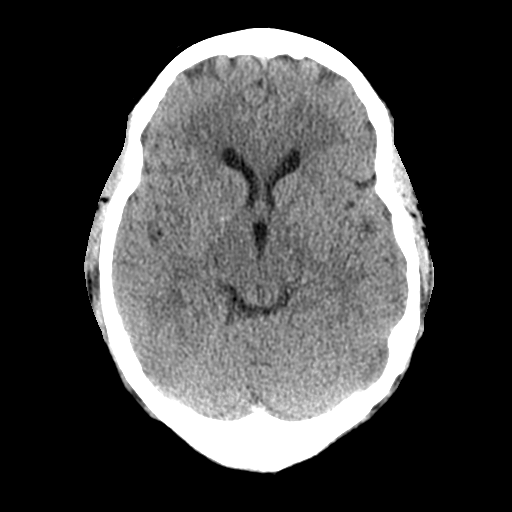
[im 14/31  brain]
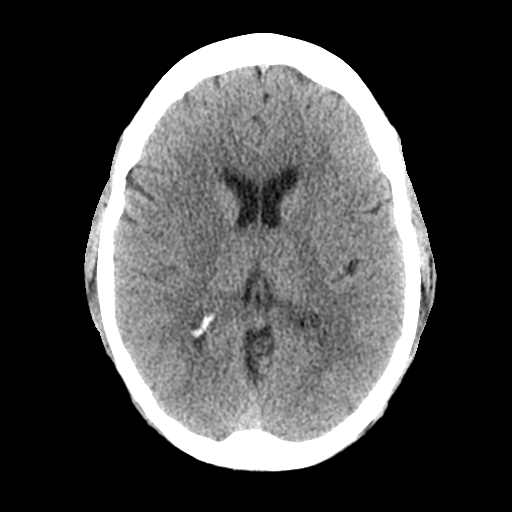
[im 14/31  bone]
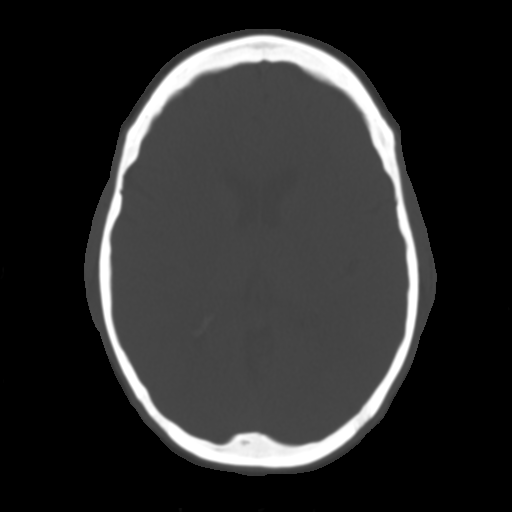
[im 17/31  brain]
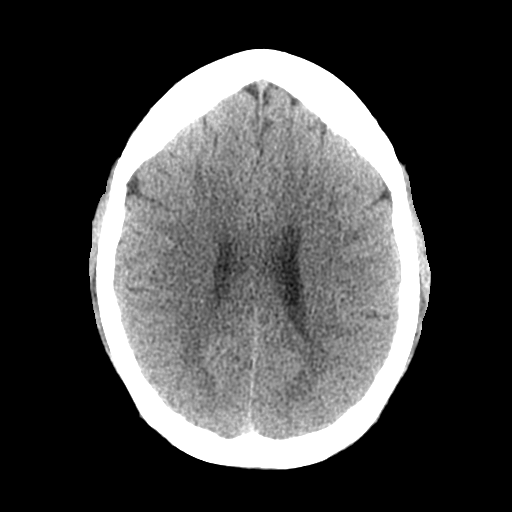
[im 20/31  brain]
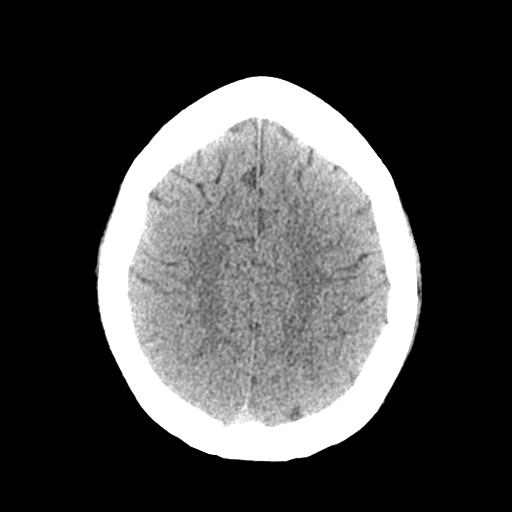
[im 23/31  brain]
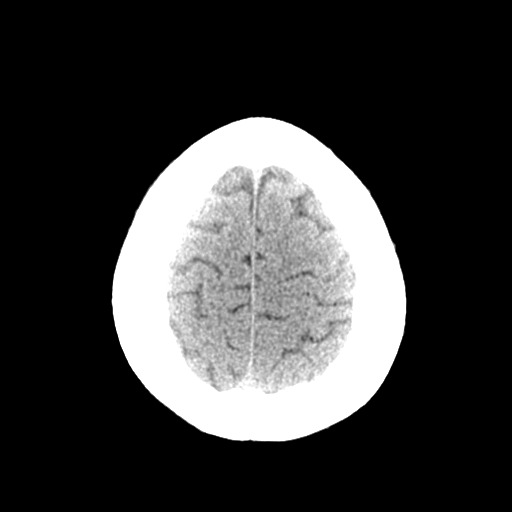
[im 25/31  brain]
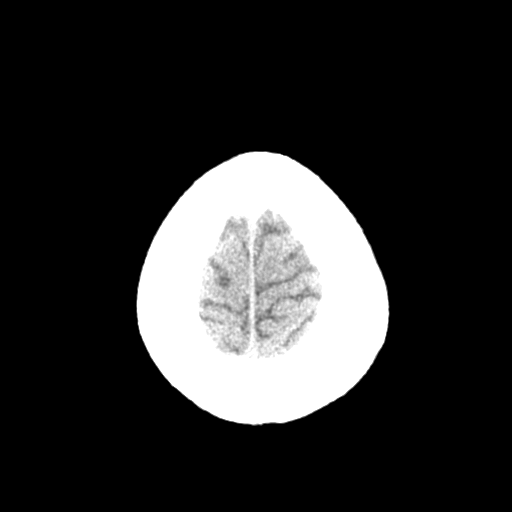
[im 25/31  bone]
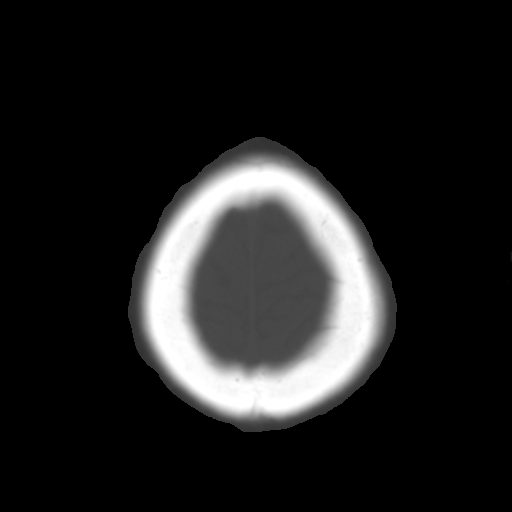
[im 28/31  brain]
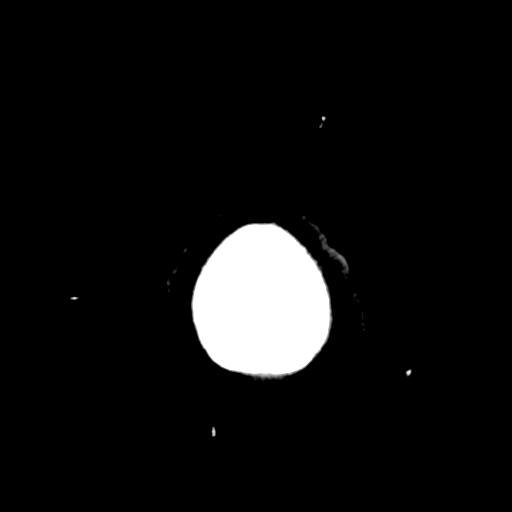

[Series 4: coronal soft tissue · coronal · 0.31mm/px · 3 of 67 slices shown]
[im 23/67  brain]
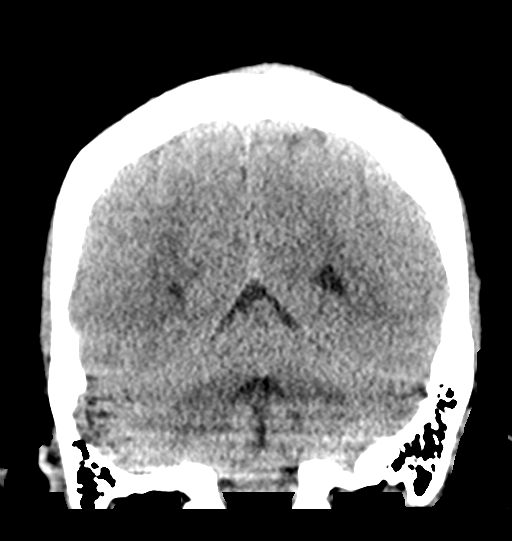
[im 30/67  brain]
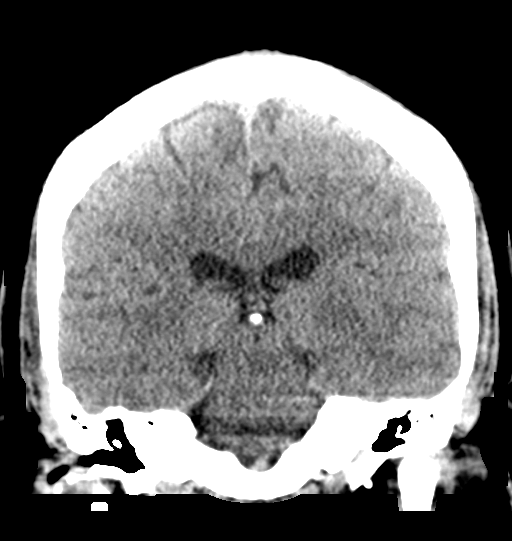
[im 37/67  brain]
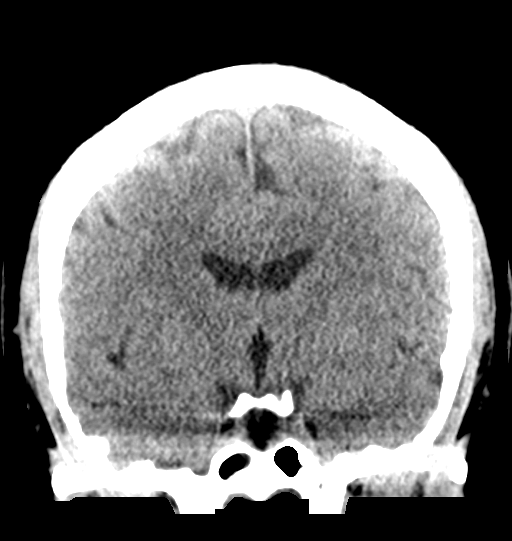

[Series 5: sagittal soft tissue · sagittal · 0.34mm/px · 3 of 54 slices shown]
[im 18/54  brain]
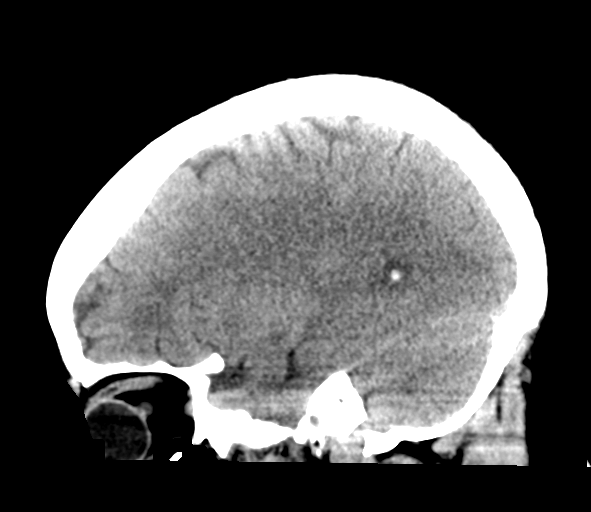
[im 27/54  brain]
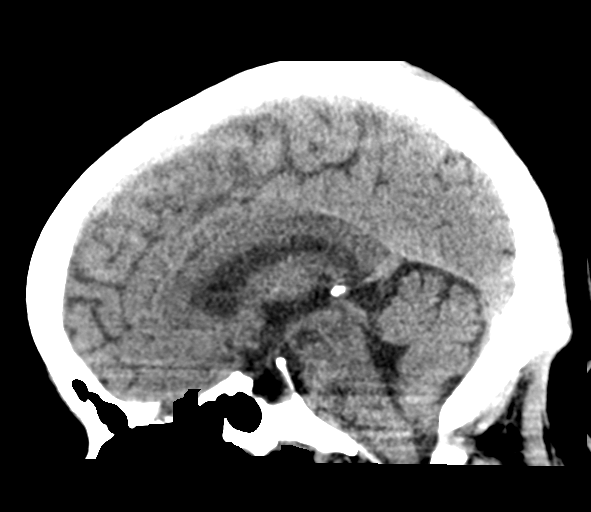
[im 36/54  brain]
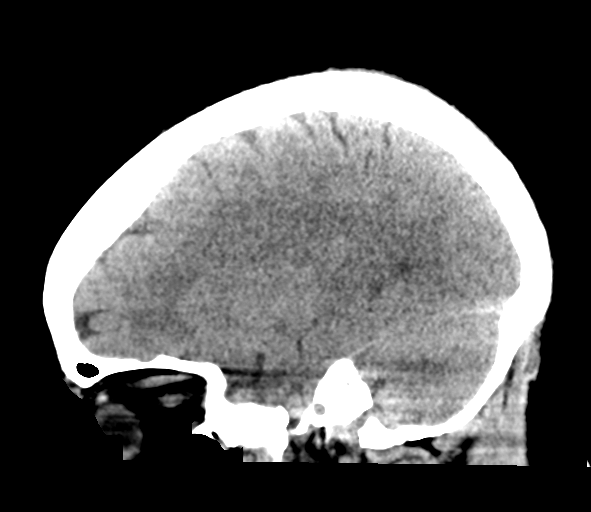

[16 of 47 positions shown; findings below may reference images not displayed]

FINDINGS: Brain: Mild, age-appropriate cerebral volume loss since 0887. No
midline shift, ventriculomegaly, mass effect, evidence of mass
lesion, intracranial hemorrhage or evidence of cortically based
acute infarction. Gray-white matter differentiation is within normal
limits throughout the brain. Chronic partially empty sella.

Vascular: No suspicious intracranial vascular hyperdensity.

Skull: Stable.  No fracture identified.

Sinuses/Orbits: Trace fluid in the right sphenoid sinus appears to
be low-density, inflammatory. Other Visualized paranasal sinuses and
mastoids are clear.

Other: Right anterior vertex scalp soft tissue injury on series 3,
image 50. Underlying calvarium appears intact. Trace soft tissue
gas. Visualized orbit soft tissues are within normal limits.
IMPRESSION: 1. Right anterior vertex scalp soft tissue injury without underlying
skull fracture.
2. Negative noncontrast CT appearance of the brain.

## 2022-03-27 DIAGNOSIS — F411 Generalized anxiety disorder: Secondary | ICD-10-CM | POA: Diagnosis not present

## 2022-04-01 DIAGNOSIS — F411 Generalized anxiety disorder: Secondary | ICD-10-CM | POA: Diagnosis not present

## 2022-04-10 DIAGNOSIS — F411 Generalized anxiety disorder: Secondary | ICD-10-CM | POA: Diagnosis not present

## 2022-04-13 DIAGNOSIS — U071 COVID-19: Secondary | ICD-10-CM | POA: Diagnosis not present

## 2022-04-19 DIAGNOSIS — F411 Generalized anxiety disorder: Secondary | ICD-10-CM | POA: Diagnosis not present

## 2022-05-15 DIAGNOSIS — Z713 Dietary counseling and surveillance: Secondary | ICD-10-CM | POA: Diagnosis not present

## 2022-08-06 DIAGNOSIS — Z124 Encounter for screening for malignant neoplasm of cervix: Secondary | ICD-10-CM | POA: Diagnosis not present

## 2022-08-06 DIAGNOSIS — N6311 Unspecified lump in the right breast, upper outer quadrant: Secondary | ICD-10-CM | POA: Diagnosis not present

## 2022-08-06 DIAGNOSIS — Z01411 Encounter for gynecological examination (general) (routine) with abnormal findings: Secondary | ICD-10-CM | POA: Diagnosis not present

## 2022-08-06 DIAGNOSIS — R92323 Mammographic fibroglandular density, bilateral breasts: Secondary | ICD-10-CM | POA: Diagnosis not present

## 2022-08-06 DIAGNOSIS — Z30431 Encounter for routine checking of intrauterine contraceptive device: Secondary | ICD-10-CM | POA: Diagnosis not present

## 2022-08-06 DIAGNOSIS — N644 Mastodynia: Secondary | ICD-10-CM | POA: Diagnosis not present

## 2022-08-06 DIAGNOSIS — D259 Leiomyoma of uterus, unspecified: Secondary | ICD-10-CM | POA: Diagnosis not present

## 2022-08-06 DIAGNOSIS — N6001 Solitary cyst of right breast: Secondary | ICD-10-CM | POA: Diagnosis not present

## 2022-08-16 DIAGNOSIS — R509 Fever, unspecified: Secondary | ICD-10-CM | POA: Diagnosis not present

## 2022-08-16 DIAGNOSIS — J069 Acute upper respiratory infection, unspecified: Secondary | ICD-10-CM | POA: Diagnosis not present

## 2022-08-16 DIAGNOSIS — R051 Acute cough: Secondary | ICD-10-CM | POA: Diagnosis not present

## 2022-08-16 DIAGNOSIS — H43391 Other vitreous opacities, right eye: Secondary | ICD-10-CM | POA: Diagnosis not present

## 2022-08-16 DIAGNOSIS — Z03818 Encounter for observation for suspected exposure to other biological agents ruled out: Secondary | ICD-10-CM | POA: Diagnosis not present

## 2022-08-19 DIAGNOSIS — J22 Unspecified acute lower respiratory infection: Secondary | ICD-10-CM | POA: Diagnosis not present

## 2022-10-12 DIAGNOSIS — Z23 Encounter for immunization: Secondary | ICD-10-CM | POA: Diagnosis not present

## 2022-12-25 DIAGNOSIS — D509 Iron deficiency anemia, unspecified: Secondary | ICD-10-CM | POA: Diagnosis not present

## 2022-12-25 DIAGNOSIS — F418 Other specified anxiety disorders: Secondary | ICD-10-CM | POA: Diagnosis not present

## 2022-12-25 DIAGNOSIS — Z0000 Encounter for general adult medical examination without abnormal findings: Secondary | ICD-10-CM | POA: Diagnosis not present

## 2022-12-25 DIAGNOSIS — I10 Essential (primary) hypertension: Secondary | ICD-10-CM | POA: Diagnosis not present

## 2022-12-25 DIAGNOSIS — E669 Obesity, unspecified: Secondary | ICD-10-CM | POA: Diagnosis not present

## 2022-12-25 DIAGNOSIS — Z833 Family history of diabetes mellitus: Secondary | ICD-10-CM | POA: Diagnosis not present

## 2023-03-06 DIAGNOSIS — G4719 Other hypersomnia: Secondary | ICD-10-CM | POA: Diagnosis not present

## 2023-03-19 DIAGNOSIS — D509 Iron deficiency anemia, unspecified: Secondary | ICD-10-CM | POA: Diagnosis not present

## 2023-03-25 DIAGNOSIS — G4733 Obstructive sleep apnea (adult) (pediatric): Secondary | ICD-10-CM | POA: Diagnosis not present

## 2023-03-26 DIAGNOSIS — E669 Obesity, unspecified: Secondary | ICD-10-CM | POA: Diagnosis not present

## 2023-03-26 DIAGNOSIS — G4733 Obstructive sleep apnea (adult) (pediatric): Secondary | ICD-10-CM | POA: Diagnosis not present

## 2023-04-23 DIAGNOSIS — Z713 Dietary counseling and surveillance: Secondary | ICD-10-CM | POA: Diagnosis not present

## 2023-05-23 DIAGNOSIS — R6882 Decreased libido: Secondary | ICD-10-CM | POA: Diagnosis not present

## 2023-05-23 DIAGNOSIS — R1013 Epigastric pain: Secondary | ICD-10-CM | POA: Diagnosis not present

## 2023-05-23 DIAGNOSIS — N951 Menopausal and female climacteric states: Secondary | ICD-10-CM | POA: Diagnosis not present

## 2023-09-04 DIAGNOSIS — Z1231 Encounter for screening mammogram for malignant neoplasm of breast: Secondary | ICD-10-CM | POA: Diagnosis not present

## 2023-09-04 DIAGNOSIS — Z30431 Encounter for routine checking of intrauterine contraceptive device: Secondary | ICD-10-CM | POA: Diagnosis not present

## 2023-09-04 DIAGNOSIS — Z1331 Encounter for screening for depression: Secondary | ICD-10-CM | POA: Diagnosis not present

## 2023-09-04 DIAGNOSIS — Z124 Encounter for screening for malignant neoplasm of cervix: Secondary | ICD-10-CM | POA: Diagnosis not present

## 2023-09-04 DIAGNOSIS — F520 Hypoactive sexual desire disorder: Secondary | ICD-10-CM | POA: Diagnosis not present

## 2023-09-04 DIAGNOSIS — Z01411 Encounter for gynecological examination (general) (routine) with abnormal findings: Secondary | ICD-10-CM | POA: Diagnosis not present

## 2023-10-11 DIAGNOSIS — Z23 Encounter for immunization: Secondary | ICD-10-CM | POA: Diagnosis not present
